# Patient Record
Sex: Male | Born: 1965 | Race: Black or African American | Hispanic: No | Smoking: Current every day smoker
Health system: Southern US, Community
[De-identification: ages and names within clinical notes are randomized; demographics above are authoritative.]

## PROBLEM LIST (undated history)

## (undated) DIAGNOSIS — Z789 Other specified health status: Secondary | ICD-10-CM

## (undated) DIAGNOSIS — F32A Depression, unspecified: Secondary | ICD-10-CM

## (undated) DIAGNOSIS — H9190 Unspecified hearing loss, unspecified ear: Secondary | ICD-10-CM

## (undated) DIAGNOSIS — T7840XA Allergy, unspecified, initial encounter: Secondary | ICD-10-CM

## (undated) DIAGNOSIS — C20 Malignant neoplasm of rectum: Secondary | ICD-10-CM

## (undated) HISTORY — PX: OTHER SURGICAL HISTORY: SHX169

## (undated) HISTORY — DX: Depression, unspecified: F32.A

## (undated) HISTORY — DX: Allergy, unspecified, initial encounter: T78.40XA

---

## 2016-11-19 DIAGNOSIS — F329 Major depressive disorder, single episode, unspecified: Secondary | ICD-10-CM | POA: Insufficient documentation

## 2016-11-21 DIAGNOSIS — F1014 Alcohol abuse with alcohol-induced mood disorder: Secondary | ICD-10-CM | POA: Insufficient documentation

## 2017-09-14 ENCOUNTER — Other Ambulatory Visit: Payer: Self-pay

## 2017-09-14 ENCOUNTER — Inpatient Hospital Stay (HOSPITAL_COMMUNITY)
Admission: EM | Admit: 2017-09-14 | Discharge: 2017-09-17 | DRG: 375 | Disposition: A | Discharge status: Home / Self Care | Payer: Medicaid Other | Attending: Internal Medicine | Admitting: Internal Medicine

## 2017-09-14 ENCOUNTER — Encounter (HOSPITAL_COMMUNITY): Payer: Self-pay | Admitting: Emergency Medicine

## 2017-09-14 ENCOUNTER — Emergency Department (HOSPITAL_COMMUNITY): Payer: Medicaid Other

## 2017-09-14 DIAGNOSIS — R740 Nonspecific elevation of levels of transaminase and lactic acid dehydrogenase [LDH]: Secondary | ICD-10-CM | POA: Diagnosis present

## 2017-09-14 DIAGNOSIS — K921 Melena: Secondary | ICD-10-CM

## 2017-09-14 DIAGNOSIS — K625 Hemorrhage of anus and rectum: Secondary | ICD-10-CM | POA: Diagnosis present

## 2017-09-14 DIAGNOSIS — F17210 Nicotine dependence, cigarettes, uncomplicated: Secondary | ICD-10-CM | POA: Diagnosis present

## 2017-09-14 DIAGNOSIS — R000 Tachycardia, unspecified: Secondary | ICD-10-CM | POA: Diagnosis present

## 2017-09-14 DIAGNOSIS — R197 Diarrhea, unspecified: Secondary | ICD-10-CM

## 2017-09-14 DIAGNOSIS — C218 Malignant neoplasm of overlapping sites of rectum, anus and anal canal: Principal | ICD-10-CM | POA: Diagnosis present

## 2017-09-14 DIAGNOSIS — Z800 Family history of malignant neoplasm of digestive organs: Secondary | ICD-10-CM

## 2017-09-14 DIAGNOSIS — D62 Acute posthemorrhagic anemia: Secondary | ICD-10-CM | POA: Diagnosis present

## 2017-09-14 DIAGNOSIS — K6289 Other specified diseases of anus and rectum: Secondary | ICD-10-CM

## 2017-09-14 DIAGNOSIS — R7401 Elevation of levels of liver transaminase levels: Secondary | ICD-10-CM

## 2017-09-14 DIAGNOSIS — K529 Noninfective gastroenteritis and colitis, unspecified: Secondary | ICD-10-CM

## 2017-09-14 HISTORY — DX: Other specified health status: Z78.9

## 2017-09-14 LAB — COMPREHENSIVE METABOLIC PANEL
ALBUMIN: 3.7 g/dL (ref 3.5–5.0)
ALT: 123 U/L — AB (ref 17–63)
ANION GAP: 11 (ref 5–15)
AST: 79 U/L — AB (ref 15–41)
Alkaline Phosphatase: 52 U/L (ref 38–126)
BILIRUBIN TOTAL: 0.6 mg/dL (ref 0.3–1.2)
BUN: 11 mg/dL (ref 6–20)
CO2: 26 mmol/L (ref 22–32)
Calcium: 9.1 mg/dL (ref 8.9–10.3)
Chloride: 102 mmol/L (ref 101–111)
Creatinine, Ser: 0.94 mg/dL (ref 0.61–1.24)
GFR calc Af Amer: >60 mL/min (ref >60)
GFR calc non Af Amer: >60 mL/min (ref >60)
GLUCOSE: 94 mg/dL (ref 65–99)
Potassium: 4.2 mmol/L (ref 3.5–5.1)
SODIUM: 139 mmol/L (ref 135–145)
Total Protein: 7.5 g/dL (ref 6.5–8.1)

## 2017-09-14 LAB — ETHANOL

## 2017-09-14 LAB — CBC
HCT: 43.2 % (ref 39.0–52.0)
HEMOGLOBIN: 13.9 g/dL (ref 13.0–17.0)
MCH: 28.1 pg (ref 26.0–34.0)
MCHC: 32.2 g/dL (ref 30.0–36.0)
MCV: 87.4 fL (ref 78.0–100.0)
Platelets: 425 10*3/uL — ABNORMAL HIGH (ref 150–400)
RBC: 4.94 MIL/uL (ref 4.22–5.81)
RDW: 15.6 % — AB (ref 11.5–15.5)
WBC: 6.2 10*3/uL (ref 4.0–10.5)

## 2017-09-14 LAB — ACETAMINOPHEN LEVEL

## 2017-09-14 LAB — TYPE AND SCREEN
ABO/RH(D): O POS
Antibody Screen: NEGATIVE

## 2017-09-14 LAB — C DIFFICILE QUICK SCREEN W PCR REFLEX
C DIFFICILE (CDIFF) TOXIN: NEGATIVE
C DIFFICLE (CDIFF) ANTIGEN: NEGATIVE
C Diff interpretation: NOT DETECTED

## 2017-09-14 LAB — POC OCCULT BLOOD, ED: Fecal Occult Bld: POSITIVE — AB

## 2017-09-14 LAB — SALICYLATE LEVEL

## 2017-09-14 MED ORDER — ACETAMINOPHEN 325 MG PO TABS
650.0000 mg | ORAL_TABLET | Freq: Four times a day (QID) | ORAL | Status: DC | PRN
  Administered 2017-09-15: 650 mg via ORAL
  Filled 2017-09-14: qty 2

## 2017-09-14 MED ORDER — SODIUM CHLORIDE 0.9 % IV SOLN
INTRAVENOUS | Status: DC
  Administered 2017-09-14: 100 mL/h via INTRAVENOUS

## 2017-09-14 MED ORDER — METRONIDAZOLE IN NACL 5-0.79 MG/ML-% IV SOLN
500.0000 mg | Freq: Once | INTRAVENOUS | Status: AC
  Administered 2017-09-14: 500 mg via INTRAVENOUS
  Filled 2017-09-14: qty 100

## 2017-09-14 MED ORDER — ACETAMINOPHEN 650 MG RE SUPP: 650.0000 mg | Freq: Four times a day (QID) | RECTAL | Status: DC | PRN

## 2017-09-14 MED ORDER — CIPROFLOXACIN IN D5W 400 MG/200ML IV SOLN
400.0000 mg | Freq: Two times a day (BID) | INTRAVENOUS | Status: DC
  Administered 2017-09-15 – 2017-09-16 (×3): 400 mg via INTRAVENOUS
  Filled 2017-09-14 (×3): qty 200

## 2017-09-14 MED ORDER — SODIUM CHLORIDE 0.9 % IV BOLUS (SEPSIS): 500.0000 mL | Freq: Once | INTRAVENOUS | Status: DC

## 2017-09-14 MED ORDER — METRONIDAZOLE IN NACL 5-0.79 MG/ML-% IV SOLN
500.0000 mg | Freq: Three times a day (TID) | INTRAVENOUS | Status: DC
  Administered 2017-09-15 – 2017-09-16 (×5): 500 mg via INTRAVENOUS
  Filled 2017-09-14 (×5): qty 100

## 2017-09-14 MED ORDER — CIPROFLOXACIN IN D5W 400 MG/200ML IV SOLN
400.0000 mg | Freq: Once | INTRAVENOUS | Status: AC
  Administered 2017-09-14: 400 mg via INTRAVENOUS
  Filled 2017-09-14: qty 200

## 2017-09-14 MED ORDER — SODIUM CHLORIDE 0.9 % IV BOLUS (SEPSIS)
1000.0000 mL | Freq: Once | INTRAVENOUS | Status: AC
  Administered 2017-09-14: 1000 mL via INTRAVENOUS

## 2017-09-14 MED ORDER — SODIUM CHLORIDE 0.9 % IV SOLN
INTRAVENOUS | Status: AC
  Administered 2017-09-14: 19:00:00 via INTRAVENOUS

## 2017-09-14 NOTE — Consult Note (Signed)
Referring Provider: Dr. Thurnell Garbe  Primary Care Physician:  None Primary Gastroenterologist:  Dr. Gala Romney   Date of Admission: 09/14/17 Date of Consultation: 09/14/17  Reason for Consultation:  Proctocolitis   HPI:  Ryan Ogborn is a 52 y.o. year old male presenting to the ED today with worsening rectal bleeding and diarrhea for the past 2 weeks. He had presented to [facility] for similar presentation and was given Cipro and "something to stop the bleeding". Appears he was given Lysteda. He is not on any chronic medications, and he does not have a PCP.  Thus far, Cdiff quick scan with PCR reflex negative. GI pathogen panel has been ordered and pending. CT scan without contrast with dilatation and wall thickening of distal sigmoid colon and proximal rectum with surrounding inflammation, concerning for proctocolitis. Narrowing of the most distal aspect of rectum also noted. Rectal exam in ED with large amount of maroon diarrhea stool at time of examination. Hgb normal at 13.9. Empiric Cipro and Flagyl have been ordered.   States rectal bleeding has been going on several months but started worsening 2 weeks ago. Associated tenesmus. Occasional abdominal discomfort but nothing significant. Associated diarrhea starting 3 weeks ago. Not having diarrhea daily, usually 1-2 loose stools. Was afraid to eat, thinking it would help decrease incidence of diarrhea. States he is hungry and would like to eat. No nausea or vomiting. Has chills but no fever. Denies any prior imaging. No sick contacts. City water. Ibuprofen every once in awhile. States a few weeks ago weighed 152. The other night took one dose of excedrin. Denies routine tylenol use. States he has been under a lot of personal stress. Living with his sister, Dorien Chihuahua, currently. Previously lived in [facility].    Father diagnosed with colon cancer in his 41s, passed from colon cancer.   Past Medical History:  Diagnosis Date  . Medical history  non-contributory     Past Surgical History:  Procedure Laterality Date  . None      Prior to Admission medications   Medication Sig Start Date End Date Taking? Authorizing Provider  ciprofloxacin (CIPRO) 500 MG tablet Take 500 mg by mouth 2 (two) times daily. for 10 days 09/03/17   [provider]  tranexamic acid (LYSTEDA) 650 MG TABS tablet Take 1,300 mg by mouth 3 (three) times daily. 08/31/17   [provider]    Current Facility-Administered Medications  Medication Dose Route Frequency Provider Last Rate Last Dose  . 0.9 %  sodium chloride infusion   Intravenous Continuous Francine Graven, DO      . ciprofloxacin (CIPRO) IVPB 400 mg  400 mg Intravenous Once Francine Graven, DO      . metroNIDAZOLE (FLAGYL) IVPB 500 mg  500 mg Intravenous Once Francine Graven, DO      . sodium chloride 0.9 % bolus 1,000 mL  1,000 mL Intravenous Once Francine Graven, DO       Current Outpatient Medications  Medication Sig Dispense Refill  . ciprofloxacin (CIPRO) 500 MG tablet Take 500 mg by mouth 2 (two) times daily. for 10 days  0  . tranexamic acid (LYSTEDA) 650 MG TABS tablet Take 1,300 mg by mouth 3 (three) times daily.  0    Allergies as of 09/14/2017  . (Not on File)    Family History  Problem Relation Age of Onset  . Colon cancer Father        diagnosed in his 34s, succumbed to disease  . Inflammatory bowel disease Neg  Hx     Social History   Socioeconomic History  . Marital status: Legally Separated    Spouse name: Not on file  . Number of children: Not on file  . Years of education: Not on file  . Highest education level: Not on file  Social Needs  . Financial resource strain: Not on file  . Food insecurity - worry: Not on file  . Food insecurity - inability: Not on file  . Transportation needs - medical: Not on file  . Transportation needs - non-medical: Not on file  Occupational History  . Not on file  Tobacco Use  . Smoking status: Current  Every Day Smoker    Packs/day: 1.00    Types: Cigarettes    Start date: 08/14/1997  . Smokeless tobacco: Never Used  Substance and Sexual Activity  . Alcohol use: Yes    Comment: 2-3 beers, 3-4 times per week   . Drug use: No  . Sexual activity: Not on file  Other Topics Concern  . Not on file  Social History Narrative  . Not on file    Review of Systems: Gen: see HPI  CV: Denies chest pain, heart palpitations, syncope, edema  Resp: Denies shortness of breath with rest, cough, wheezing GI: see HPI  GU : Denies urinary burning, urinary frequency, urinary incontinence.  MS: Denies joint pain,swelling, cramping Derm: Denies rash, itching, dry skin Psych: Denies depression, anxiety,confusion, or memory loss Heme: see HPI   Physical Exam: Vital signs in last 24 hours: Temp:  [98.4 F (36.9 C)] 98.4 F (36.9 C) (02/11 1208) Pulse Rate:  [112] 112 (02/11 1208) Resp:  [16] 16 (02/11 1208) BP: (134)/(99) 134/99 (02/11 1208) SpO2:  [100 %] 100 % (02/11 1208) Weight:  [146 lb (66.2 kg)] 146 lb (66.2 kg) (02/11 1206)   General:   Alert,  Well-developed, well-nourished, pleasant and cooperative in NAD, tearful at times.  Head:  Normocephalic and atraumatic. Eyes:  Sclera clear, no icterus.   Ears:  Normal auditory acuity. Nose:  No deformity, discharge,  or lesions. Mouth:  No deformity or lesions, dentition normal. Lungs:  Clear throughout to auscultation.    Heart:  S1 S2 present, slightly tachycardic in the low 1teens  Abdomen:  Soft, nontender and nondistended. No masses, hepatosplenomegaly or hernias noted. Normal bowel sounds, without guarding, and without rebound.   Rectal:  Deferred until time of colonoscopy.   Msk:  Symmetrical without gross deformities. Normal posture. Extremities:  Without edema. Neurologic:  Alert and  oriented x4 Psych:  Alert and cooperative. Normal mood and affect.  Intake/Output from previous day: No intake/output data recorded. Intake/Output  this shift: No intake/output data recorded.  Lab Results: Recent Labs    09/14/17 1246  WBC 6.2  HGB 13.9  HCT 43.2  PLT 425*   BMET Recent Labs    09/14/17 1246  NA 139  K 4.2  CL 102  CO2 26  GLUCOSE 94  BUN 11  CREATININE 0.94  CALCIUM 9.1   LFT Recent Labs    09/14/17 1246  PROT 7.5  ALBUMIN 3.7  AST 79*  ALT 123*  ALKPHOS 52  BILITOT 0.6   C-Diff Recent Labs    09/14/17 1345  CDIFFTOX NEGATIVE    Studies/Results: Ct Abdomen Pelvis Wo Contrast  Result Date: 09/14/2017 CLINICAL DATA:  Rectal bleeding. EXAM: CT ABDOMEN AND PELVIS WITHOUT CONTRAST TECHNIQUE: Multidetector CT imaging of the abdomen and pelvis was performed following the standard protocol without IV  contrast. COMPARISON:  None. FINDINGS: Lower chest: No acute abnormality. Hepatobiliary: No focal liver abnormality is seen. No gallstones, gallbladder wall thickening, or biliary dilatation. Pancreas: Unremarkable. No pancreatic ductal dilatation or surrounding inflammatory changes. Spleen: Normal in size without focal abnormality. Adrenals/Urinary Tract: Adrenal glands are unremarkable. Kidneys are normal, without renal calculi, focal lesion, or hydronephrosis. Bladder is unremarkable. Stomach/Bowel: The stomach appears normal. The appendix is not clearly visualized. Stool is noted throughout the colon. There is dilatation of the rectum and distal sigmoid colon with wall thickening and surrounding inflammation. There narrowing of the most distal aspect of the rectum, and neoplasm cannot be excluded. Vascular/Lymphatic: No significant vascular findings are present. No enlarged abdominal or pelvic lymph nodes. Reproductive: Prostate is unremarkable. Other: No abdominal wall hernia or abnormality. No abdominopelvic ascites. Musculoskeletal: No acute or significant osseous findings. IMPRESSION: Dilatation and wall thickening of the distal sigmoid colon and proximal rectum is noted with surrounding inflammation,  concerning for proctocolitis. Narrowing of the most distal aspect of the rectum is noted, and possible neoplasm or obstruction cannot be excluded. Sigmoidoscopy is recommended for further evaluation. Electronically Signed   By: Marijo Conception, M.D.   On: 09/14/2017 15:19    Impression: 52 year old male with history of chronic intermittent rectal bleeding, now with increased rectal bleeding and associated diarrhea and tenesmus for the past 2 weeks. CT scan with concern for proctocolitis, narrowing of distal aspect of rectum noted. Rectal exam performed by ED. No prior colonoscopy. He reports seeking care at [facility] about a week ago, and it appears he was prescribed Cipro and another medication (possbily Lysteda). Cdiff quick scan negative, and GI pathogen panel has been collected and pending.  Has taken Ibuprofen intermittently and one dose of excedrin a few nights ago.  Clinically, he appears comfortable and non-toxic. He is quite tearful at time of visit and was reassured regarding plan of care. Will await GI pathogen panel. Will ultimately need direct visualization via colonoscopy when clinically appropriate and anticipate possibly as inpatient. Agree with empiric antibiotics. Will also order clear liquids for now.   Elevated transaminases: unknown baseline. AST 79, ALT 123. Endorses alcohol use. Hepatitis panel ordered. Does not endorse recent or frequent tylenol use. Elevation multifactorial in setting of acute illness. Will continue to follow.   Plan: Clear liquids Agree with Cipro and Flagyl Await GI pathogen panel Repeat HFP tomorrow Will need diagnostic colonoscopy at some point in the near future when clinically appropriate: will continue to follow with you during hospitalization.  Annitta Needs, PhD, ANP-BC [facility]     LOS: 0 days    09/14/2017, 4:23 PM

## 2017-09-14 NOTE — ED Provider Notes (Signed)
[facility] EMERGENCY DEPARTMENT Provider Note   CSN: [id number] Arrival date & time: 09/14/17  1203     History   Chief Complaint Chief Complaint  Patient presents with  . Rectal Bleeding    HPI Tanner Dudley is a 52 y.o. male.  HPI  Pt was seen at 1330. Per pt, c/o gradual onset and persistence of constant rectal bleeding for the past 2 weeks. Has been associated with diarrhea. Pt states the blood and stool "just run out of him." Pt has been wearing a Depends. Pt states he was evaluated at [facility] ED for this complaint 2 weeks ago, but does not recall what they told him, other that to f/u with a GI doctor. Pt has not obtained f/u with GI MD yet. Pt states he has not been eating well because it makes him feel like he needs to go to the bathroom. States he occasionally will have abd "pain." Denies rectal pain, no N/V, no CP/SOB, no fevers, no back pain.    History reviewed. No pertinent past medical history.  There are no active problems to display for this patient.   History reviewed. No pertinent surgical history.    Home Medications    Prior to Admission medications   Not on File    Family History History reviewed. No pertinent family history.  Social History Social History   Tobacco Use  . Smoking status: Never Smoker  . Smokeless tobacco: Never Used  Substance Use Topics  . Alcohol use: Not on file  . Drug use: Not on file     Allergies   Patient has no allergy information on record.   Review of Systems Review of Systems ROS: Statement: All systems negative except as marked or noted in the HPI; Constitutional: Negative for fever and chills. ; ; Eyes: Negative for eye pain, redness and discharge. ; ; ENMT: Negative for ear pain, hoarseness, nasal congestion, sinus pressure and sore throat. ; ; Cardiovascular: Negative for chest pain, palpitations, diaphoresis, dyspnea and peripheral edema. ; ; Respiratory: Negative for cough, wheezing and stridor.  ; ; Gastrointestinal: Negative for nausea, vomiting, hematemesis, jaundice. +abd pain, diarrhea, rectal bleeding. ; ; Genitourinary: Negative for dysuria, flank pain and hematuria. ; ; Musculoskeletal: Negative for back pain and neck pain. Negative for swelling and trauma.; ; Skin: Negative for pruritus, rash, abrasions, blisters, bruising and skin lesion.; ; Neuro: Negative for headache, lightheadedness and neck stiffness. Negative for weakness, altered level of consciousness, altered mental status, extremity weakness, paresthesias, involuntary movement, seizure and syncope.       Physical Exam Updated Vital Signs BP (!) 134/99 (BP Location: Right Arm)   Pulse (!) 112   Temp 98.4 F (36.9 C) (Oral)   Resp 16   Ht 5\' 8"  (1.727 m)   Wt 66.2 kg (146 lb)   SpO2 100%   BMI 22.20 kg/m   Physical Exam 1335: Physical examination:  Nursing notes reviewed; Vital signs and O2 SAT reviewed;  Constitutional: Well developed, Well nourished, Well hydrated, In no acute distress; Head:  Normocephalic, atraumatic; Eyes: EOMI, PERRL, No scleral icterus; ENMT: Mouth and pharynx normal, Mucous membranes moist; Neck: Supple, Full range of motion, No lymphadenopathy; Cardiovascular: Tachycardic rate and rhythm, No gallop; Respiratory: Breath sounds clear & equal bilaterally, No wheezes.  Speaking full sentences with ease, Normal respiratory effort/excursion; Chest: Nontender, Movement normal; Abdomen: Soft, +mild diffuse tenderness to palp. No rebound or guarding. Nondistended, Normal bowel sounds. Rectal exam performed w/permission of pt and  ED RN chaperone present.  Anal tone normal.  Non-tender, large amount of maroon diarrheal stool coming out of pt's rectum after exam, heme positive..; Genitourinary: No CVA tenderness; Spine:  No midline CS, TS, LS tenderness.;; Extremities: Pulses normal, No tenderness, No edema, No calf edema or asymmetry.; Neuro: AA&Ox3, vague historian. Major CN grossly intact.  Speech clear.  No gross focal motor or sensory deficits in extremities.; Skin: Color normal, Warm, Dry.    ED Treatments / Results  Labs (all labs ordered are listed, but only abnormal results are displayed)   EKG  EKG Interpretation None       Radiology   Procedures Procedures (including critical care time)  Medications Ordered in ED Medications  0.9 %  sodium chloride infusion (not administered)     Initial Impression / Assessment and Plan / ED Course  I have reviewed the triage vital signs and the nursing notes.  Pertinent labs & imaging results that were available during my care of the patient were reviewed by me and considered in my medical decision making (see chart for details).  MDM Reviewed: previous chart, nursing note and vitals Reviewed previous: labs Interpretation: labs and CT scan    Results for orders placed or performed during the hospital encounter of 09/14/17  C difficile quick scan w PCR reflex  Result Value Ref Range   C Diff antigen NEGATIVE NEGATIVE   C Diff toxin NEGATIVE NEGATIVE   C Diff interpretation No C. difficile detected.   Comprehensive metabolic panel  Result Value Ref Range   Sodium 139 135 - 145 mmol/L   Potassium 4.2 3.5 - 5.1 mmol/L   Chloride 102 101 - 111 mmol/L   CO2 26 22 - 32 mmol/L   Glucose, Bld 94 65 - 99 mg/dL   BUN 11 6 - 20 mg/dL   Creatinine, Ser 0.94 0.61 - 1.24 mg/dL   Calcium 9.1 8.9 - 10.3 mg/dL   Total Protein 7.5 6.5 - 8.1 g/dL   Albumin 3.7 3.5 - 5.0 g/dL   AST 79 (H) 15 - 41 U/L   ALT 123 (H) 17 - 63 U/L   Alkaline Phosphatase 52 38 - 126 U/L   Total Bilirubin 0.6 0.3 - 1.2 mg/dL   GFR calc non Af Amer >60 >60 mL/min   GFR calc Af Amer >60 >60 mL/min   Anion gap 11 5 - 15  CBC  Result Value Ref Range   WBC 6.2 4.0 - 10.5 K/uL   RBC 4.94 4.22 - 5.81 MIL/uL   Hemoglobin 13.9 13.0 - 17.0 g/dL   HCT 43.2 39.0 - 52.0 %   MCV 87.4 78.0 - 100.0 fL   MCH 28.1 26.0 - 34.0 pg   MCHC 32.2 30.0 - 36.0 g/dL   RDW  15.6 (H) 11.5 - 15.5 %   Platelets 425 (H) 150 - 400 K/uL  POC occult blood, ED  Result Value Ref Range   Fecal Occult Bld POSITIVE (A) NEGATIVE  Type and screen [facility]  Result Value Ref Range   ABO/RH(D) O POS    Antibody Screen NEG    Sample Expiration      09/17/2017 Performed at [facility], [address], [place], [place] [address]    Ct Abdomen Pelvis Wo Contrast Result Date: 09/14/2017 CLINICAL DATA:  Rectal bleeding. EXAM: CT ABDOMEN AND PELVIS WITHOUT CONTRAST TECHNIQUE: Multidetector CT imaging of the abdomen and pelvis was performed following the standard protocol without IV contrast. COMPARISON:  None.  FINDINGS: Lower chest: No acute abnormality. Hepatobiliary: No focal liver abnormality is seen. No gallstones, gallbladder wall thickening, or biliary dilatation. Pancreas: Unremarkable. No pancreatic ductal dilatation or surrounding inflammatory changes. Spleen: Normal in size without focal abnormality. Adrenals/Urinary Tract: Adrenal glands are unremarkable. Kidneys are normal, without renal calculi, focal lesion, or hydronephrosis. Bladder is unremarkable. Stomach/Bowel: The stomach appears normal. The appendix is not clearly visualized. Stool is noted throughout the colon. There is dilatation of the rectum and distal sigmoid colon with wall thickening and surrounding inflammation. There narrowing of the most distal aspect of the rectum, and neoplasm cannot be excluded. Vascular/Lymphatic: No significant vascular findings are present. No enlarged abdominal or pelvic lymph nodes. Reproductive: Prostate is unremarkable. Other: No abdominal wall hernia or abnormality. No abdominopelvic ascites. Musculoskeletal: No acute or significant osseous findings. IMPRESSION: Dilatation and wall thickening of the distal sigmoid colon and proximal rectum is noted with surrounding inflammation, concerning for proctocolitis. Narrowing of the most distal aspect of the rectum is noted, and  possible neoplasm or obstruction cannot be excluded. Sigmoidoscopy is recommended for further evaluation. Electronically Signed   By: Marijo Conception, M.D.   On: 09/14/2017 15:19     1340:  Pt having copious bloody diarrhea during rectal exam. Pt cannot remember what he was told during his [facility] ED visit other than to f/u with GI MD. States he was given meds; unknown what they were or what they were to treat. Will obtain medical records.  1530:  CT scan as above; will start IV cipro/flagyl. Pt will multiple bloody diarrheal stools while in the ED. H/H stable at this time. IVF bolus and gtt started. Remains NPO. Continue to await records from [facility]. T/C to GI Dr. Gala Romney, case discussed, including:  HPI, pertinent PM/SHx, VS/PE, dx testing, ED course and treatment:  Agreeable to consult.   1535:  T/C to [facility] Dr. Maudie Mercury, case discussed, including:  HPI, pertinent PM/SHx, VS/PE, dx testing, ED course and treatment:  Agreeable to admit.      Final Clinical Impressions(s) / ED Diagnoses   Final diagnoses:  None    ED Discharge Orders    None       Francine Graven, DO 09/16/17 0272

## 2017-09-14 NOTE — ED Notes (Signed)
Pt going to CT

## 2017-09-14 NOTE — ED Notes (Signed)
EDP at the bedside, rectal exam, pt having bloody soft stool. Pt cleaned up, sample taken to lab

## 2017-09-14 NOTE — H&P (Addendum)
TRH H&P   Patient Demographics:    Tanner Dudley, is a 52 y.o. male  MRN: [id number]   DOB - 1965-12-24  Admit Date - 09/14/2017  Outpatient Primary MD for the patient is Patient, No Pcp Per  Referring MD/NP/PA: Francine Graven  Outpatient Specialists:   Patient coming from: home  Chief Complaint  Patient presents with  . Rectal Bleeding      HPI:    Tanner Dudley  is a 52 y.o. male, w c/o diarrhea and rectal bleeding intermittently for the past 2 weeks.  Today his diarrhea was worse, went at least 10 times and had multiple bleeding per rectum.  On toilet paper and in toilet bowel.  Pt has never had prior colonscopy.  No family hx of Inflammatory bowel disease.  Pt denies any odd foods eaten.  No recent travel. Pt presented due to worsening diarrhea and rectal bleeding.   In ED,  CT abd/pelvis IMPRESSION: Dilatation and wall thickening of the distal sigmoid colon and proximal rectum is noted with surrounding inflammation, concerning for proctocolitis. Narrowing of the most distal aspect of the rectum is noted, and possible neoplasm or obstruction cannot be excluded.Sigmoidoscopy is recommended for further evaluation.   Na 139 K 4.2, Bun 11, Creatinine 0.94 Ast 79, Alt 123  Wbc 6.2, Hgb 13.9 Plt 425  C. Diff and GI pathogen panel pending  Etoh <10   Pt will be admitted for w/up of diarrhea and rectal bleeding.  ? Proctocolitis        Review of systems:    In addition to the HPI above,  No Fever-chills, No Headache, No changes with Vision or hearing, No problems swallowing food or Liquids, No Chest pain, Cough or Shortness of Breath, No Abdominal pain,  No Blood in Urine, No dysuria, No new skin rashes or bruises, No new joints pains-aches,  No new weakness, tingling, numbness in any extremity, No recent weight gain or loss, No polyuria, polydypsia  or polyphagia, No significant Mental Stressors.  A full 10 point Review of Systems was done, except as stated above, all other Review of Systems were negative.   With Past History of the following :    Past Medical History:  Diagnosis Date  . Medical history non-contributory       Past Surgical History:  Procedure Laterality Date  . None        Social History:     Social History   Tobacco Use  . Smoking status: Current Every Day Smoker    Packs/day: 1.00    Types: Cigarettes    Start date: 08/14/1997  . Smokeless tobacco: Never Used  Substance Use Topics  . Alcohol use: Yes    Comment: 2-3 beers, 3-4 times per week      Lives - at home  Mobility -   Walks by self   Family History :  Family History  Problem Relation Age of Onset  . Colon cancer Father        diagnosed in his 54s, succumbed to disease  . Inflammatory bowel disease Neg Hx       Home Medications:   Prior to Admission medications   Medication Sig Start Date End Date Taking? Authorizing Provider  ciprofloxacin (CIPRO) 500 MG tablet Take 500 mg by mouth 2 (two) times daily. for 10 days 09/03/17   [provider]  tranexamic acid (LYSTEDA) 650 MG TABS tablet Take 1,300 mg by mouth 3 (three) times daily. 08/31/17   [provider]     Allergies:    No Known Allergies   Physical Exam:   Vitals  Blood pressure 115/88, pulse 78, temperature 98.3 F (36.8 C), temperature source Oral, resp. rate 19, height 5\' 8"  (1.727 m), weight 64.7 kg (142 lb 10.2 oz), SpO2 100 %.   1. General lying in bed in NAD,    2. Normal affect and insight, Not Suicidal or Homicidal, Awake Alert, Oriented X 3.  3. No F.N deficits, ALL C.Nerves Intact, Strength 5/5 all 4 extremities, Sensation intact all 4 extremities, Plantars down going.  4. Ears and Eyes appear Normal, Conjunctivae clear, PERRLA. Moist Oral Mucosa.  5. Supple Neck, No JVD, No cervical lymphadenopathy appriciated, No  Carotid Bruits.  6. Symmetrical Chest wall movement, Good air movement bilaterally, CTAB.  7. RRR, No Gallops, Rubs or Murmurs, No Parasternal Heave.  8. Positive Bowel Sounds, Abdomen Soft, No tenderness, No organomegaly appriciated,No rebound -guarding or rigidity.  9.  No Cyanosis, Normal Skin Turgor, No Skin Rash or Bruise.  10. Good muscle tone,  joints appear normal , no effusions, Normal ROM.  11. No Palpable Lymph Nodes in Neck or Axillae    Data Review:    CBC Recent Labs  Lab 09/14/17 1246  WBC 6.2  HGB 13.9  HCT 43.2  PLT 425*  MCV 87.4  MCH 28.1  MCHC 32.2  RDW 15.6*   ------------------------------------------------------------------------------------------------------------------  Chemistries  Recent Labs  Lab 09/14/17 1246  NA 139  K 4.2  CL 102  CO2 26  GLUCOSE 94  BUN 11  CREATININE 0.94  CALCIUM 9.1  AST 79*  ALT 123*  ALKPHOS 52  BILITOT 0.6   ------------------------------------------------------------------------------------------------------------------ estimated creatinine clearance is 87 mL/min (by C-G formula based on SCr of 0.94 mg/dL). ------------------------------------------------------------------------------------------------------------------ No results for input(s): TSH, T4TOTAL, T3FREE, THYROIDAB in the last 72 hours.  Invalid input(s): FREET3  Coagulation profile No results for input(s): INR, PROTIME in the last 168 hours. ------------------------------------------------------------------------------------------------------------------- No results for input(s): DDIMER in the last 72 hours. -------------------------------------------------------------------------------------------------------------------  Cardiac Enzymes No results for input(s): CKMB, TROPONINI, MYOGLOBIN in the last 168 hours.  Invalid input(s):  CK ------------------------------------------------------------------------------------------------------------------ No results found for: BNP   ---------------------------------------------------------------------------------------------------------------  Urinalysis No results found for: COLORURINE, APPEARANCEUR, LABSPEC, PHURINE, GLUCOSEU, HGBUR, BILIRUBINUR, KETONESUR, PROTEINUR, UROBILINOGEN, NITRITE, LEUKOCYTESUR  ----------------------------------------------------------------------------------------------------------------   Imaging Results:    Ct Abdomen Pelvis Wo Contrast  Result Date: 09/14/2017 CLINICAL DATA:  Rectal bleeding. EXAM: CT ABDOMEN AND PELVIS WITHOUT CONTRAST TECHNIQUE: Multidetector CT imaging of the abdomen and pelvis was performed following the standard protocol without IV contrast. COMPARISON:  None. FINDINGS: Lower chest: No acute abnormality. Hepatobiliary: No focal liver abnormality is seen. No gallstones, gallbladder wall thickening, or biliary dilatation. Pancreas: Unremarkable. No pancreatic ductal dilatation or surrounding inflammatory changes. Spleen: Normal in size without focal abnormality. Adrenals/Urinary Tract: Adrenal glands are unremarkable. Kidneys are normal, without  renal calculi, focal lesion, or hydronephrosis. Bladder is unremarkable. Stomach/Bowel: The stomach appears normal. The appendix is not clearly visualized. Stool is noted throughout the colon. There is dilatation of the rectum and distal sigmoid colon with wall thickening and surrounding inflammation. There narrowing of the most distal aspect of the rectum, and neoplasm cannot be excluded. Vascular/Lymphatic: No significant vascular findings are present. No enlarged abdominal or pelvic lymph nodes. Reproductive: Prostate is unremarkable. Other: No abdominal wall hernia or abnormality. No abdominopelvic ascites. Musculoskeletal: No acute or significant osseous findings. IMPRESSION: Dilatation  and wall thickening of the distal sigmoid colon and proximal rectum is noted with surrounding inflammation, concerning for proctocolitis. Narrowing of the most distal aspect of the rectum is noted, and possible neoplasm or obstruction cannot be excluded. Sigmoidoscopy is recommended for further evaluation. Electronically Signed   By: Marijo Conception, M.D.   On: 09/14/2017 15:19       Assessment & Plan:    Principal Problem:   Rectal bleeding Active Problems:   Diarrhea    Rectal bleeding ? Proctocolitis cipro 400mg  iv bid Flagyl 500mg  iv tid NPO Hydrate with ns iv GI consulted by Ed, appreciate input  Diarrhea Check C. Diff Check GI Pathogen panel  Abnormal liver function Check acute hepatitis panel Consider further w/up such as ferritin, iron, tibc, ceruloplasmin, alpha 1 antitripsin, ANA, antismooth muscle ab, celiac panel  Tachycardia Tele Trop I q6h x3 If persistent or if trop positive    DVT Prophylaxis  Lovenox - SCDs  AM Labs Ordered, also please review Full Orders  Family Communication: Admission, patients condition and plan of care including tests being ordered have been discussed with the patient  who indicate understanding and agree with the plan and Code Status.  Code Status FULL CODE  Likely DC to  home  Condition GUARDED    Consults called:   GI by ED  Admission status: inpatient  Time spent in minutes : 45   Jani Gravel M.D on 09/14/2017 at 7:04 PM  Between 7am to 7pm - Pager - [phone]  . After 7pm go to [email or web address] - password [facility]  [facility] - Office  [phone]

## 2017-09-14 NOTE — ED Triage Notes (Signed)
Pt reports rectal bleeding for last several weeks. Pt reports seen for same at [facility]. Pt reports bright red bleeding with voiding.

## 2017-09-14 NOTE — ED Notes (Signed)
Hospitalist at the bedside 

## 2017-09-14 NOTE — ED Notes (Signed)
Pt has appetite, but not eating well, due to eating make him feel like he needs to go to the bathroom, continues to have diarrhea, bright red blood, pt is pale

## 2017-09-15 ENCOUNTER — Inpatient Hospital Stay (HOSPITAL_COMMUNITY): Payer: Medicaid Other

## 2017-09-15 ENCOUNTER — Encounter (HOSPITAL_COMMUNITY): Payer: Self-pay | Admitting: Gastroenterology

## 2017-09-15 DIAGNOSIS — K529 Noninfective gastroenteritis and colitis, unspecified: Secondary | ICD-10-CM

## 2017-09-15 DIAGNOSIS — K625 Hemorrhage of anus and rectum: Secondary | ICD-10-CM

## 2017-09-15 LAB — COMPREHENSIVE METABOLIC PANEL
ALT: 93 U/L — ABNORMAL HIGH (ref 17–63)
AST: 58 U/L — AB (ref 15–41)
Albumin: 2.8 g/dL — ABNORMAL LOW (ref 3.5–5.0)
Alkaline Phosphatase: 39 U/L (ref 38–126)
Anion gap: 8 (ref 5–15)
BUN: 9 mg/dL (ref 6–20)
CHLORIDE: 105 mmol/L (ref 101–111)
CO2: 23 mmol/L (ref 22–32)
Calcium: 8.3 mg/dL — ABNORMAL LOW (ref 8.9–10.3)
Creatinine, Ser: 0.79 mg/dL (ref 0.61–1.24)
Glucose, Bld: 114 mg/dL — ABNORMAL HIGH (ref 65–99)
POTASSIUM: 3.6 mmol/L (ref 3.5–5.1)
Sodium: 136 mmol/L (ref 135–145)
Total Bilirubin: 0.7 mg/dL (ref 0.3–1.2)
Total Protein: 5.6 g/dL — ABNORMAL LOW (ref 6.5–8.1)

## 2017-09-15 LAB — GASTROINTESTINAL PANEL BY PCR, STOOL (REPLACES STOOL CULTURE)
Adenovirus F40/41: NOT DETECTED
Astrovirus: NOT DETECTED
CAMPYLOBACTER SPECIES: NOT DETECTED
CRYPTOSPORIDIUM: NOT DETECTED
Cyclospora cayetanensis: NOT DETECTED
Entamoeba histolytica: NOT DETECTED
Enteroaggregative E coli (EAEC): NOT DETECTED
Enteropathogenic E coli (EPEC): NOT DETECTED
Enterotoxigenic E coli (ETEC): NOT DETECTED
Giardia lamblia: NOT DETECTED
NOROVIRUS GI/GII: NOT DETECTED
PLESIMONAS SHIGELLOIDES: NOT DETECTED
ROTAVIRUS A: NOT DETECTED
SALMONELLA SPECIES: NOT DETECTED
SHIGA LIKE TOXIN PRODUCING E COLI (STEC): NOT DETECTED
SHIGELLA/ENTEROINVASIVE E COLI (EIEC): NOT DETECTED
Sapovirus (I, II, IV, and V): NOT DETECTED
Vibrio cholerae: NOT DETECTED
Vibrio species: NOT DETECTED
YERSINIA ENTEROCOLITICA: NOT DETECTED

## 2017-09-15 LAB — CBC
HCT: 34.3 % — ABNORMAL LOW (ref 39.0–52.0)
HCT: 33.8 % — ABNORMAL LOW (ref 39.0–52.0)
Hemoglobin: 11 g/dL — ABNORMAL LOW (ref 13.0–17.0)
Hemoglobin: 10.7 g/dL — ABNORMAL LOW (ref 13.0–17.0)
MCH: 27.6 pg (ref 26.0–34.0)
MCH: 27.9 pg (ref 26.0–34.0)
MCHC: 31.7 g/dL (ref 30.0–36.0)
MCHC: 32.1 g/dL (ref 30.0–36.0)
MCV: 87.1 fL (ref 78.0–100.0)
MCV: 87.1 fL (ref 78.0–100.0)
PLATELETS: 371 10*3/uL (ref 150–400)
Platelets: 343 10*3/uL (ref 150–400)
RBC: 3.88 MIL/uL — ABNORMAL LOW (ref 4.22–5.81)
RBC: 3.94 MIL/uL — AB (ref 4.22–5.81)
RDW: 15.3 % (ref 11.5–15.5)
RDW: 15.3 % (ref 11.5–15.5)
WBC: 6.1 10*3/uL (ref 4.0–10.5)
WBC: 4.7 10*3/uL (ref 4.0–10.5)

## 2017-09-15 LAB — TROPONIN I
Troponin I: <0.03 ng/mL (ref <0.03)
Troponin I: <0.03 ng/mL (ref <0.03)

## 2017-09-15 LAB — RAPID URINE DRUG SCREEN, HOSP PERFORMED

## 2017-09-15 LAB — TSH: TSH: 1.234 u[IU]/mL (ref 0.350–4.500)

## 2017-09-15 MED ORDER — PEG 3350-KCL-NA BICARB-NACL 420 G PO SOLR
2000.0000 mL | Freq: Once | ORAL | Status: AC
  Administered 2017-09-15: 2000 mL via ORAL
  Filled 2017-09-15: qty 4000

## 2017-09-15 MED ORDER — PEG 3350-KCL-NA BICARB-NACL 420 G PO SOLR
2000.0000 mL | Freq: Once | ORAL | Status: AC
  Administered 2017-09-16: 2000 mL via ORAL

## 2017-09-15 MED ORDER — POTASSIUM CHLORIDE IN NACL 20-0.9 MEQ/L-% IV SOLN
INTRAVENOUS | Status: DC
  Administered 2017-09-15 – 2017-09-17 (×4): via INTRAVENOUS

## 2017-09-15 NOTE — Progress Notes (Addendum)
Subjective: One episode of small loose stool overnight with small amount bright red blood per rectum. No abdominal pain, N/V.   Objective: Vital signs in last 24 hours: Temp:  [97.5 F (36.4 C)-98.7 F (37.1 C)] 97.5 F (36.4 C) (02/12 0527) Pulse Rate:  [72-112] 72 (02/12 0527) Resp:  [15-20] 15 (02/12 0527) BP: (111-134)/(80-99) 119/82 (02/12 0527) SpO2:  [99 %-100 %] 100 % (02/12 0527) Weight:  [140 lb 3.4 oz (63.6 kg)-146 lb (66.2 kg)] 140 lb 3.4 oz (63.6 kg) (02/12 0527) Last BM Date: 09/14/17 General:   Alert and oriented, pleasant Head:  Normocephalic and atraumatic. Eyes:  No icterus, sclera clear. Conjuctiva pink.  Abdomen:  Bowel sounds present, soft, non-tender, non-distended. No HSM or hernias noted. No rebound or guarding. No masses appreciated  Extremities:  Without edema. Neurologic:  Alert and  oriented x4 Psych:  Normal mood and affect.  Intake/Output from previous day: 02/11 0701 - 02/12 0700 In: 3103.3 [P.O.:100; I.V.:1703.3; IV Piggyback:1300] Out: 650 [Urine:650] Intake/Output this shift: No intake/output data recorded.  Lab Results: Recent Labs    09/14/17 1246 09/15/17 0021 09/15/17 0622  WBC 6.2 6.1 4.7  HGB 13.9 11.0* 10.7*  HCT 43.2 34.3* 33.8*  PLT 425* 371 343   BMET Recent Labs    09/14/17 1246 09/15/17 0622  NA 139 136  K 4.2 3.6  CL 102 105  CO2 26 23  GLUCOSE 94 114*  BUN 11 9  CREATININE 0.94 0.79  CALCIUM 9.1 8.3*   LFT Recent Labs    09/14/17 1246 09/15/17 0622  PROT 7.5 5.6*  ALBUMIN 3.7 2.8*  AST 79* 58*  ALT 123* 93*  ALKPHOS 52 39  BILITOT 0.6 0.7     Studies/Results: Ct Abdomen Pelvis Wo Contrast  Result Date: 09/14/2017 CLINICAL DATA:  Rectal bleeding. EXAM: CT ABDOMEN AND PELVIS WITHOUT CONTRAST TECHNIQUE: Multidetector CT imaging of the abdomen and pelvis was performed following the standard protocol without IV contrast. COMPARISON:  None. FINDINGS: Lower chest: No acute abnormality.  Hepatobiliary: No focal liver abnormality is seen. No gallstones, gallbladder wall thickening, or biliary dilatation. Pancreas: Unremarkable. No pancreatic ductal dilatation or surrounding inflammatory changes. Spleen: Normal in size without focal abnormality. Adrenals/Urinary Tract: Adrenal glands are unremarkable. Kidneys are normal, without renal calculi, focal lesion, or hydronephrosis. Bladder is unremarkable. Stomach/Bowel: The stomach appears normal. The appendix is not clearly visualized. Stool is noted throughout the colon. There is dilatation of the rectum and distal sigmoid colon with wall thickening and surrounding inflammation. There narrowing of the most distal aspect of the rectum, and neoplasm cannot be excluded. Vascular/Lymphatic: No significant vascular findings are present. No enlarged abdominal or pelvic lymph nodes. Reproductive: Prostate is unremarkable. Other: No abdominal wall hernia or abnormality. No abdominopelvic ascites. Musculoskeletal: No acute or significant osseous findings. IMPRESSION: Dilatation and wall thickening of the distal sigmoid colon and proximal rectum is noted with surrounding inflammation, concerning for proctocolitis. Narrowing of the most distal aspect of the rectum is noted, and possible neoplasm or obstruction cannot be excluded. Sigmoidoscopy is recommended for further evaluation. Electronically Signed   By: Marijo Conception, M.D.   On: 09/14/2017 15:19    Assessment: 52 year old male with history of chronic intermittent rectal bleeding, now with increased rectal bleeding and associated diarrhea and tenesmus for the past 2 weeks. CT scan with concern for proctocolitis, narrowing of distal aspect of rectum noted. Rectal exam performed by ED. No prior colonoscopy. Presented to [facility] 1/28 with Hgb 14.5.  Found to have UTI as well, prescribed Cipro.Thus far, Cdiff quick scan negative, and GI pathogen panel has been collected and pending. Clinically improved with  only one small, loose stool overnight mixed with blood. Hgb is down to 10.7 today, 13.9 on admission and 14.5 at [facility].   Will re-order clear liquids. Anticipate colonoscopy with Propofol on 2/13. Could potentially be discharged thereafter pending findings of colonoscopy. Doubt dealing with infectious process. Consider discontinuing antibiotics.   Elevated transaminases: Transaminases normal at [facility] 08/31/17.  Improved today from admission. Endorses alcohol use. Hepatitis panel ordered and pending. Likely multifactorial in setting of acute illness. Would follow as outpatient.   As of note, urine rapid drug screen unable to be analyzed. Discussed with lab, who states the machine was not able to process it. He denies any illicit drug use.   Plan: Clear liquids today Follow CBC Follow LFTs as outpatient with further evaluation as appropriate Await pending hepatitis panel Anticipate colonoscopy on 09/16/17 with Propofol, with hopeful discharge thereafter   Annitta Needs, PhD, ANP-BC [facility]    LOS: 1 day    09/15/2017, 8:11 AM

## 2017-09-15 NOTE — Progress Notes (Signed)
PROGRESS NOTE    Tanner Dudley  EUM:[id number] DOB: 06/02/1966 DOA: 09/14/2017 PCP: Patient, No Pcp Per     Brief Narrative:  52 year old man admitted from home on 2/11 after he noticed some bloody diarrhea.  He was found to have signs of proctocolitis on CT scan and admission is requested.   Assessment & Plan:   Principal Problem:   Rectal bleeding Active Problems:   Diarrhea   Proctocolitis with rectal bleeding   Proctocolitis/hematochezia -Agree with continued antibiotics, currently on Cipro and Flagyl. -C. difficile is negative, stool pathogen panel is pending. -GI is following, unclear if they will perform colonoscopy this admission.  Acute blood loss anemia -Secondary to above. -Hemoglobin is currently 10.7 which is down 3 g since admission, however no indication for acute transfusion.  Transaminitis -Improving. -Check acute hepatitis panel and right upper quadrant ultrasound.   DVT prophylaxis: SCDs Code Status: Full Code Family Communication: Patient only Disposition Plan: Anticipate discharge home over next 24-48 hours  Consultants:   GI, Dr. Oneida Alar  Procedures:   None  Antimicrobials:  Anti-infectives (From admission, onward)   Start     Dose/Rate Route Frequency Ordered Stop   09/15/17 0600  ciprofloxacin (CIPRO) IVPB 400 mg     400 mg 200 mL/hr over 60 Minutes Intravenous Every 12 hours 09/14/17 1823     09/15/17 0100  metroNIDAZOLE (FLAGYL) IVPB 500 mg     500 mg 100 mL/hr over 60 Minutes Intravenous Every 8 hours 09/14/17 1823     09/14/17 1545  ciprofloxacin (CIPRO) IVPB 400 mg     400 mg 200 mL/hr over 60 Minutes Intravenous  Once 09/14/17 1531 09/14/17 1845   09/14/17 1545  metroNIDAZOLE (FLAGYL) IVPB 500 mg     500 mg 100 mL/hr over 60 Minutes Intravenous  Once 09/14/17 1531 09/14/17 1806       Subjective: Feels well, did have some bloody diarrhea again today, denies abdominal cramping.  Objective: Vitals:   09/14/17 1836  09/14/17 2118 09/15/17 0527 09/15/17 1413  BP: 115/88 125/80 119/82 117/76  Pulse: 78 73 72 72  Resp: 19 20 15 16   Temp: 98.3 F (36.8 C) 98.7 F (37.1 C) (!) 97.5 F (36.4 C) 98 F (36.7 C)  TempSrc: Oral Oral Oral Oral  SpO2: 100% 100% 100% 100%  Weight: 64.7 kg (142 lb 10.2 oz)  63.6 kg (140 lb 3.4 oz)   Height: 5\' 8"  (1.727 m)       Intake/Output Summary (Last 24 hours) at 09/15/2017 1605 Last data filed at 09/15/2017 1509 Gross per 24 hour  Intake 3464.59 ml  Output 650 ml  Net 2814.59 ml   Filed Weights   09/14/17 1206 09/14/17 1836 09/15/17 0527  Weight: 66.2 kg (146 lb) 64.7 kg (142 lb 10.2 oz) 63.6 kg (140 lb 3.4 oz)    Examination:  General exam: Alert, awake, oriented x 3 Respiratory system: Clear to auscultation. Respiratory effort normal. Cardiovascular system:RRR. No murmurs, rubs, gallops. Gastrointestinal system: Abdomen is nondistended, soft and nontender. No organomegaly or masses felt. Normal bowel sounds heard. Central nervous system: Alert and oriented. No focal neurological deficits. Extremities: No C/C/E, +pedal pulses Skin: No rashes, lesions or ulcers Psychiatry: Judgement and insight appear normal. Mood & affect appropriate.     Data Reviewed: I have personally reviewed following labs and imaging studies  CBC: Recent Labs  Lab 09/14/17 1246 09/15/17 0021 09/15/17 0622  WBC 6.2 6.1 4.7  HGB 13.9 11.0* 10.7*  HCT 43.2 34.3* 33.8*  MCV 87.4 87.1 87.1  PLT 425* 371 161   Basic Metabolic Panel: Recent Labs  Lab 09/14/17 1246 09/15/17 0622  NA 139 136  K 4.2 3.6  CL 102 105  CO2 26 23  GLUCOSE 94 114*  BUN 11 9  CREATININE 0.94 0.79  CALCIUM 9.1 8.3*   GFR: Estimated Creatinine Clearance: 100.5 mL/min (by C-G formula based on SCr of 0.79 mg/dL). Liver Function Tests: Recent Labs  Lab 09/14/17 1246 09/15/17 0622  AST 79* 58*  ALT 123* 93*  ALKPHOS 52 39  BILITOT 0.6 0.7  PROT 7.5 5.6*  ALBUMIN 3.7 2.8*   No results for  input(s): LIPASE, AMYLASE in the last 168 hours. No results for input(s): AMMONIA in the last 168 hours. Coagulation Profile: No results for input(s): INR, PROTIME in the last 168 hours. Cardiac Enzymes: Recent Labs  Lab 09/15/17 0021 09/15/17 0622 09/15/17 1204  TROPONINI <0.03 <0.03 <0.03   BNP (last 3 results) No results for input(s): PROBNP in the last 8760 hours. HbA1C: No results for input(s): HGBA1C in the last 72 hours. CBG: No results for input(s): GLUCAP in the last 168 hours. Lipid Profile: No results for input(s): CHOL, HDL, LDLCALC, TRIG, CHOLHDL, LDLDIRECT in the last 72 hours. Thyroid Function Tests: Recent Labs    09/15/17 0021  TSH 1.234   Anemia Panel: No results for input(s): VITAMINB12, FOLATE, FERRITIN, TIBC, IRON, RETICCTPCT in the last 72 hours. Urine analysis: No results found for: COLORURINE, APPEARANCEUR, LABSPEC, PHURINE, GLUCOSEU, HGBUR, BILIRUBINUR, KETONESUR, PROTEINUR, UROBILINOGEN, NITRITE, LEUKOCYTESUR Sepsis Labs: @LABRCNTIP (procalcitonin:4,lacticidven:4)  ) Recent Results (from the past 240 hour(s))  Gastrointestinal Panel by PCR , Stool     Status: Abnormal   Collection Time: 09/14/17  1:45 PM  Result Value Ref Range Status   Campylobacter species NOT DETECTED NOT DETECTED Final   Plesimonas shigelloides NOT DETECTED NOT DETECTED Final   Salmonella species NOT DETECTED NOT DETECTED Final   Yersinia enterocolitica NOT DETECTED NOT DETECTED Final   Vibrio species NOT DETECTED NOT DETECTED Final   Vibrio cholerae NOT DETECTED NOT DETECTED Final   Enteroaggregative E coli (EAEC) NOT DETECTED NOT DETECTED Final   Enteropathogenic E coli (EPEC) NOT DETECTED NOT DETECTED Final   Enterotoxigenic E coli (ETEC) NOT DETECTED NOT DETECTED Final   Shiga like toxin producing E coli (STEC) NOT DETECTED NOT DETECTED Final   E. coli O157 NONE (A) NOT DETECTED Final   Shigella/Enteroinvasive E coli (EIEC) NOT DETECTED NOT DETECTED Final    Cryptosporidium NOT DETECTED NOT DETECTED Final   Cyclospora cayetanensis NOT DETECTED NOT DETECTED Final   Entamoeba histolytica NOT DETECTED NOT DETECTED Final   Giardia lamblia NOT DETECTED NOT DETECTED Final   Adenovirus F40/41 NOT DETECTED NOT DETECTED Final   Astrovirus NOT DETECTED NOT DETECTED Final   Norovirus GI/GII NOT DETECTED NOT DETECTED Final   Rotavirus A NOT DETECTED NOT DETECTED Final   Sapovirus (I, II, IV, and V) NOT DETECTED NOT DETECTED Final    Comment: Performed at [facility], [address] [place], [place] [address]  C difficile quick scan w PCR reflex     Status: None   Collection Time: 09/14/17  1:45 PM  Result Value Ref Range Status   C Diff antigen NEGATIVE NEGATIVE Final   C Diff toxin NEGATIVE NEGATIVE Final   C Diff interpretation No C. difficile detected.  Final    Comment: Performed at [facility], [address], [place], [place] [address]  Radiology Studies: Ct Abdomen Pelvis Wo Contrast  Result Date: 09/14/2017 CLINICAL DATA:  Rectal bleeding. EXAM: CT ABDOMEN AND PELVIS WITHOUT CONTRAST TECHNIQUE: Multidetector CT imaging of the abdomen and pelvis was performed following the standard protocol without IV contrast. COMPARISON:  None. FINDINGS: Lower chest: No acute abnormality. Hepatobiliary: No focal liver abnormality is seen. No gallstones, gallbladder wall thickening, or biliary dilatation. Pancreas: Unremarkable. No pancreatic ductal dilatation or surrounding inflammatory changes. Spleen: Normal in size without focal abnormality. Adrenals/Urinary Tract: Adrenal glands are unremarkable. Kidneys are normal, without renal calculi, focal lesion, or hydronephrosis. Bladder is unremarkable. Stomach/Bowel: The stomach appears normal. The appendix is not clearly visualized. Stool is noted throughout the colon. There is dilatation of the rectum and distal sigmoid colon with wall thickening and surrounding inflammation. There narrowing of  the most distal aspect of the rectum, and neoplasm cannot be excluded. Vascular/Lymphatic: No significant vascular findings are present. No enlarged abdominal or pelvic lymph nodes. Reproductive: Prostate is unremarkable. Other: No abdominal wall hernia or abnormality. No abdominopelvic ascites. Musculoskeletal: No acute or significant osseous findings. IMPRESSION: Dilatation and wall thickening of the distal sigmoid colon and proximal rectum is noted with surrounding inflammation, concerning for proctocolitis. Narrowing of the most distal aspect of the rectum is noted, and possible neoplasm or obstruction cannot be excluded. Sigmoidoscopy is recommended for further evaluation. Electronically Signed   By: Marijo Conception, M.D.   On: 09/14/2017 15:19        Scheduled Meds: . [START ON 09/16/2017] polyethylene glycol-electrolytes  2,000 mL Oral Once   Continuous Infusions: . 0.9 % NaCl with KCl 20 mEq / L 75 mL/hr at 09/15/17 1420  . ciprofloxacin Stopped (09/15/17 8921)  . metronidazole Stopped (09/15/17 1206)     LOS: 1 day    Time spent: 25 minutes.     Lelon Frohlich, MD [facility] Pager [phone]  If 7PM-7AM, please contact night-coverage [email or web address] Password [facility] 09/15/2017, 4:05 PM

## 2017-09-15 NOTE — Progress Notes (Signed)
Pt signed consent form for colonoscopy but was very skeptical stating that no one told him about any bad things that could happen. Pt stated he was only told he was getting a colonoscopy but nothing else was explained to him about how it works or effects it can have on your body. This RN will pass on to day shift for GI to hopefully talk to pt before procedure.

## 2017-09-16 ENCOUNTER — Inpatient Hospital Stay (HOSPITAL_COMMUNITY): Payer: Medicaid Other | Admitting: Anesthesiology

## 2017-09-16 ENCOUNTER — Encounter (HOSPITAL_COMMUNITY)
Admission: EM | Disposition: A | Discharge status: Home / Self Care | Payer: Self-pay | Source: Home / Self Care | Attending: Internal Medicine

## 2017-09-16 ENCOUNTER — Inpatient Hospital Stay (HOSPITAL_COMMUNITY): Payer: Medicaid Other

## 2017-09-16 ENCOUNTER — Encounter (HOSPITAL_COMMUNITY): Payer: Self-pay | Admitting: *Deleted

## 2017-09-16 DIAGNOSIS — C20 Malignant neoplasm of rectum: Secondary | ICD-10-CM

## 2017-09-16 DIAGNOSIS — R933 Abnormal findings on diagnostic imaging of other parts of digestive tract: Secondary | ICD-10-CM

## 2017-09-16 HISTORY — PX: COLONOSCOPY WITH PROPOFOL: SHX5780

## 2017-09-16 HISTORY — PX: BIOPSY: SHX5522

## 2017-09-16 LAB — CBC
HCT: 34 % — ABNORMAL LOW (ref 39.0–52.0)
Hemoglobin: 10.7 g/dL — ABNORMAL LOW (ref 13.0–17.0)
MCH: 27.4 pg (ref 26.0–34.0)
MCHC: 31.5 g/dL (ref 30.0–36.0)
MCV: 87.2 fL (ref 78.0–100.0)
PLATELETS: 365 10*3/uL (ref 150–400)
RBC: 3.9 MIL/uL — ABNORMAL LOW (ref 4.22–5.81)
RDW: 15.3 % (ref 11.5–15.5)
WBC: 4.3 10*3/uL (ref 4.0–10.5)

## 2017-09-16 LAB — COMPREHENSIVE METABOLIC PANEL
ALK PHOS: 38 U/L (ref 38–126)
ALT: 77 U/L — AB (ref 17–63)
AST: 42 U/L — AB (ref 15–41)
Albumin: 2.8 g/dL — ABNORMAL LOW (ref 3.5–5.0)
Anion gap: 8 (ref 5–15)
BILIRUBIN TOTAL: 0.5 mg/dL (ref 0.3–1.2)
BUN: 5 mg/dL — AB (ref 6–20)
CALCIUM: 8.5 mg/dL — AB (ref 8.9–10.3)
CO2: 23 mmol/L (ref 22–32)
CREATININE: 0.78 mg/dL (ref 0.61–1.24)
Chloride: 107 mmol/L (ref 101–111)
GFR calc Af Amer: >60 mL/min (ref >60)
Glucose, Bld: 82 mg/dL (ref 65–99)
POTASSIUM: 3.7 mmol/L (ref 3.5–5.1)
Sodium: 138 mmol/L (ref 135–145)
TOTAL PROTEIN: 5.8 g/dL — AB (ref 6.5–8.1)

## 2017-09-16 LAB — HEPATITIS PANEL, ACUTE
HCV Ab: 0.1 s/co ratio (ref 0.0–0.9)
HCV Ab: <0.1 s/co ratio (ref 0.0–0.9)
Hep A IgM: NEGATIVE
Hep A IgM: NEGATIVE
Hep B C IgM: NEGATIVE
Hep B C IgM: NEGATIVE
Hepatitis B Surface Ag: NEGATIVE
Hepatitis B Surface Ag: NEGATIVE

## 2017-09-16 LAB — HIV ANTIBODY (ROUTINE TESTING W REFLEX): HIV SCREEN 4TH GENERATION: NONREACTIVE

## 2017-09-16 SURGERY — COLONOSCOPY WITH PROPOFOL
Anesthesia: Monitor Anesthesia Care

## 2017-09-16 MED ORDER — PROPOFOL 10 MG/ML IV BOLUS
INTRAVENOUS | Status: AC
  Filled 2017-09-16: qty 40

## 2017-09-16 MED ORDER — LACTATED RINGERS IV SOLN
INTRAVENOUS | Status: DC
  Administered 2017-09-16: 15:00:00 via INTRAVENOUS

## 2017-09-16 MED ORDER — LIDOCAINE VISCOUS 2 % MT SOLN
OROMUCOSAL | Status: AC
  Filled 2017-09-16: qty 15

## 2017-09-16 MED ORDER — SIMETHICONE 40 MG/0.6ML PO SUSP
ORAL | Status: AC
  Filled 2017-09-16: qty 1.2

## 2017-09-16 MED ORDER — FENTANYL CITRATE (PF) 250 MCG/5ML IJ SOLN
INTRAMUSCULAR | Status: AC
  Filled 2017-09-16: qty 5

## 2017-09-16 MED ORDER — FENTANYL CITRATE (PF) 100 MCG/2ML IJ SOLN
25.0000 ug | Freq: Once | INTRAMUSCULAR | Status: AC
  Administered 2017-09-16: 25 ug via INTRAVENOUS

## 2017-09-16 MED ORDER — LIDOCAINE HCL (PF) 1 % IJ SOLN
INTRAMUSCULAR | Status: AC
  Filled 2017-09-16: qty 5

## 2017-09-16 MED ORDER — MIDAZOLAM HCL 2 MG/2ML IJ SOLN
INTRAMUSCULAR | Status: AC
  Filled 2017-09-16: qty 2

## 2017-09-16 MED ORDER — PROPOFOL 500 MG/50ML IV EMUL
INTRAVENOUS | Status: DC | PRN
  Administered 2017-09-16: 15:00:00 via INTRAVENOUS
  Administered 2017-09-16: 200 ug/kg/min via INTRAVENOUS
  Administered 2017-09-16: 16:00:00 via INTRAVENOUS

## 2017-09-16 MED ORDER — FENTANYL CITRATE (PF) 100 MCG/2ML IJ SOLN
INTRAMUSCULAR | Status: DC | PRN
  Administered 2017-09-16: 25 ug via INTRAVENOUS

## 2017-09-16 MED ORDER — FENTANYL CITRATE (PF) 100 MCG/2ML IJ SOLN
INTRAMUSCULAR | Status: AC
  Filled 2017-09-16: qty 2

## 2017-09-16 MED ORDER — MIDAZOLAM HCL 2 MG/2ML IJ SOLN
1.0000 mg | INTRAMUSCULAR | Status: AC
  Administered 2017-09-16: 2 mg via INTRAVENOUS

## 2017-09-16 NOTE — Progress Notes (Signed)
PROGRESS NOTE    Tanner Dudley  ZOX:[id number] DOB: 08/18/1965 DOA: 09/14/2017 PCP: Patient, No Pcp Per   Brief Narrative:   This is a 52 year old male who was admitted with proctocolitis on 2/11 with symptoms of hematochezia.  He is currently on IV ciprofloxacin and Flagyl.  GI plans for colonoscopy today.  He has been noted to have a mild transaminitis for which he is undergoing workup with a right upper quadrant ultrasound as well as acute hepatitis panel.  Assessment & Plan:   Principal Problem:   Rectal bleeding Active Problems:   Diarrhea   Proctocolitis with rectal bleeding   1. Proctocolitis with hematochezia.  Continue IV ciprofloxacin and Flagyl.  Appreciate GI evaluation with colonoscopy.  Stool pathogen panel is negative along with C. difficile which was also negative. 2. Acute blood loss anemia secondary to above.  Hemoglobin remained stable with no overt bleeding currently identified.  No indication for transfusion at this time.  Repeat CBC in a.m. 3. Mild transaminitis-improving.  Await results of acute hepatitis panel as well as right upper quadrant ultrasound.   DVT prophylaxis:SCDs Code Status: Full Family Communication: None Disposition Plan: Anticipate DC in next 1-2 days   Consultants:   GI-Dr. Oneida Alar  Procedures:   Colonoscopy pending 2/13  Antimicrobials:   IV Ciprofloxacin 2/12->  IV Flagyl 2/12->   Subjective: Patient seen and evaluated today with no new acute complaints or concerns.  He states that his abdominal pain has improved and he has had no further overt bleeding overnight.  He has tolerated colon prep well yesterday with plans for colonoscopy today.  No acute concerns or events noted overnight.  Objective: Vitals:   09/15/17 0527 09/15/17 1413 09/15/17 2127 09/16/17 0500  BP: 119/82 117/76 110/74 (!) 132/93  Pulse: 72 72 70 68  Resp: 15 16 16 16   Temp: (!) 97.5 F (36.4 C) 98 F (36.7 C) 98.4 F (36.9 C) 97.6 F (36.4 C)    TempSrc: Oral Oral Oral Oral  SpO2: 100% 100% 100% 100%  Weight: 63.6 kg (140 lb 3.4 oz)   63.6 kg (140 lb 3.4 oz)  Height:        Intake/Output Summary (Last 24 hours) at 09/16/2017 1021 Last data filed at 09/16/2017 0535 Gross per 24 hour  Intake 3206.25 ml  Output 550 ml  Net 2656.25 ml   Filed Weights   09/14/17 1836 09/15/17 0527 09/16/17 0500  Weight: 64.7 kg (142 lb 10.2 oz) 63.6 kg (140 lb 3.4 oz) 63.6 kg (140 lb 3.4 oz)    Examination:  General exam: Appears calm and comfortable  Respiratory system: Clear to auscultation. Respiratory effort normal. Cardiovascular system: S1 & S2 heard, RRR. No JVD, murmurs, rubs, gallops or clicks. No pedal edema. Gastrointestinal system: Abdomen is nondistended, soft and nontender. No organomegaly or masses felt. Normal bowel sounds heard. Central nervous system: Alert and oriented. No focal neurological deficits. Extremities: Symmetric 5 x 5 power. Skin: No rashes, lesions or ulcers Psychiatry: Judgement and insight appear normal. Mood & affect appropriate.     Data Reviewed: I have personally reviewed following labs and imaging studies  CBC: Recent Labs  Lab 09/14/17 1246 09/15/17 0021 09/15/17 0622 09/16/17 0350  WBC 6.2 6.1 4.7 4.3  HGB 13.9 11.0* 10.7* 10.7*  HCT 43.2 34.3* 33.8* 34.0*  MCV 87.4 87.1 87.1 87.2  PLT 425* 371 343 811   Basic Metabolic Panel: Recent Labs  Lab 09/14/17 1246 09/15/17 0622 09/16/17 0350  NA 139 136 138  K 4.2 3.6 3.7  CL 102 105 107  CO2 26 23 23   GLUCOSE 94 114* 82  BUN 11 9 5*  CREATININE 0.94 0.79 0.78  CALCIUM 9.1 8.3* 8.5*   GFR: Estimated Creatinine Clearance: 100.5 mL/min (by C-G formula based on SCr of 0.78 mg/dL). Liver Function Tests: Recent Labs  Lab 09/14/17 1246 09/15/17 0622 09/16/17 0350  AST 79* 58* 42*  ALT 123* 93* 77*  ALKPHOS 52 39 38  BILITOT 0.6 0.7 0.5  PROT 7.5 5.6* 5.8*  ALBUMIN 3.7 2.8* 2.8*   No results for input(s): LIPASE, AMYLASE in the  last 168 hours. No results for input(s): AMMONIA in the last 168 hours. Coagulation Profile: No results for input(s): INR, PROTIME in the last 168 hours. Cardiac Enzymes: Recent Labs  Lab 09/15/17 0021 09/15/17 0622 09/15/17 1204  TROPONINI <0.03 <0.03 <0.03   BNP (last 3 results) No results for input(s): PROBNP in the last 8760 hours. HbA1C: No results for input(s): HGBA1C in the last 72 hours. CBG: No results for input(s): GLUCAP in the last 168 hours. Lipid Profile: No results for input(s): CHOL, HDL, LDLCALC, TRIG, CHOLHDL, LDLDIRECT in the last 72 hours. Thyroid Function Tests: Recent Labs    09/15/17 0021  TSH 1.234   Anemia Panel: No results for input(s): VITAMINB12, FOLATE, FERRITIN, TIBC, IRON, RETICCTPCT in the last 72 hours. Sepsis Labs: No results for input(s): PROCALCITON, LATICACIDVEN in the last 168 hours.  Recent Results (from the past 240 hour(s))  Gastrointestinal Panel by PCR , Stool     Status: Abnormal   Collection Time: 09/14/17  1:45 PM  Result Value Ref Range Status   Campylobacter species NOT DETECTED NOT DETECTED Final   Plesimonas shigelloides NOT DETECTED NOT DETECTED Final   Salmonella species NOT DETECTED NOT DETECTED Final   Yersinia enterocolitica NOT DETECTED NOT DETECTED Final   Vibrio species NOT DETECTED NOT DETECTED Final   Vibrio cholerae NOT DETECTED NOT DETECTED Final   Enteroaggregative E coli (EAEC) NOT DETECTED NOT DETECTED Final   Enteropathogenic E coli (EPEC) NOT DETECTED NOT DETECTED Final   Enterotoxigenic E coli (ETEC) NOT DETECTED NOT DETECTED Final   Shiga like toxin producing E coli (STEC) NOT DETECTED NOT DETECTED Final   E. coli O157 NONE (A) NOT DETECTED Final   Shigella/Enteroinvasive E coli (EIEC) NOT DETECTED NOT DETECTED Final   Cryptosporidium NOT DETECTED NOT DETECTED Final   Cyclospora cayetanensis NOT DETECTED NOT DETECTED Final   Entamoeba histolytica NOT DETECTED NOT DETECTED Final   Giardia lamblia NOT  DETECTED NOT DETECTED Final   Adenovirus F40/41 NOT DETECTED NOT DETECTED Final   Astrovirus NOT DETECTED NOT DETECTED Final   Norovirus GI/GII NOT DETECTED NOT DETECTED Final   Rotavirus A NOT DETECTED NOT DETECTED Final   Sapovirus (I, II, IV, and V) NOT DETECTED NOT DETECTED Final    Comment: Performed at [facility], [address] [place], [place] [address]  C difficile quick scan w PCR reflex     Status: None   Collection Time: 09/14/17  1:45 PM  Result Value Ref Range Status   C Diff antigen NEGATIVE NEGATIVE Final   C Diff toxin NEGATIVE NEGATIVE Final   C Diff interpretation No C. difficile detected.  Final    Comment: Performed at [facility], [address] [place], [place] [address]         Radiology Studies: Ct Abdomen Pelvis Wo Contrast  Result Date: 09/14/2017 CLINICAL DATA:  Rectal bleeding. EXAM:  CT ABDOMEN AND PELVIS WITHOUT CONTRAST TECHNIQUE: Multidetector CT imaging of the abdomen and pelvis was performed following the standard protocol without IV contrast. COMPARISON:  None. FINDINGS: Lower chest: No acute abnormality. Hepatobiliary: No focal liver abnormality is seen. No gallstones, gallbladder wall thickening, or biliary dilatation. Pancreas: Unremarkable. No pancreatic ductal dilatation or surrounding inflammatory changes. Spleen: Normal in size without focal abnormality. Adrenals/Urinary Tract: Adrenal glands are unremarkable. Kidneys are normal, without renal calculi, focal lesion, or hydronephrosis. Bladder is unremarkable. Stomach/Bowel: The stomach appears normal. The appendix is not clearly visualized. Stool is noted throughout the colon. There is dilatation of the rectum and distal sigmoid colon with wall thickening and surrounding inflammation. There narrowing of the most distal aspect of the rectum, and neoplasm cannot be excluded. Vascular/Lymphatic: No significant vascular findings are present. No enlarged abdominal or pelvic lymph nodes.  Reproductive: Prostate is unremarkable. Other: No abdominal wall hernia or abnormality. No abdominopelvic ascites. Musculoskeletal: No acute or significant osseous findings. IMPRESSION: Dilatation and wall thickening of the distal sigmoid colon and proximal rectum is noted with surrounding inflammation, concerning for proctocolitis. Narrowing of the most distal aspect of the rectum is noted, and possible neoplasm or obstruction cannot be excluded. Sigmoidoscopy is recommended for further evaluation. Electronically Signed   By: Marijo Conception, M.D.   On: 09/14/2017 15:19        Scheduled Meds: Continuous Infusions: . 0.9 % NaCl with KCl 20 mEq / L 75 mL/hr at 09/16/17 0617  . ciprofloxacin Stopped (09/16/17 0744)  . metronidazole 500 mg (09/16/17 0844)     LOS: 2 days    Time spent: 30 minutes    Francesa Eugenio Darleen Crocker, DO [facility] Pager [phone]  If 7PM-7AM, please contact night-coverage [email or web address] Password [facility] 09/16/2017, 10:21 AM

## 2017-09-16 NOTE — Transfer of Care (Signed)
Immediate Anesthesia Transfer of Care Note  Patient: Tanner Dudley  Procedure(s) Performed: COLONOSCOPY WITH PROPOFOL (N/A ) BIOPSY  Patient Location: PACU  Anesthesia Type:MAC  Level of Consciousness: awake and alert   Airway & Oxygen Therapy: Patient Spontanous Breathing  Post-op Assessment: Report given to RN and Post -op Vital signs reviewed and stable  Post vital signs: Reviewed and stable  Last Vitals:  Vitals:   09/15/17 2127 09/16/17 0500  BP: 110/74 (!) 132/93  Pulse: 70 68  Resp: 16 16  Temp: 36.9 C 36.4 C  SpO2: 100% 100%    Last Pain:  Vitals:   09/16/17 1509  TempSrc:   PainSc: 0-No pain      Patients Stated Pain Goal: 6 (05/11/11 1975)  Complications: No apparent anesthesia complications

## 2017-09-16 NOTE — Anesthesia Preprocedure Evaluation (Addendum)
Anesthesia Evaluation  Patient identified by MRN, date of birth, ID band Patient awake    Reviewed: Allergy & Precautions, NPO status , Patient's Chart, lab work & pertinent test results  Airway Mallampati: I  TM Distance: >3 FB Neck ROM: Full    Dental  (+) Teeth Intact   Pulmonary Current Smoker,    breath sounds clear to auscultation       Cardiovascular negative cardio ROS   Rhythm:Regular Rate:Normal     Neuro/Psych negative neurological ROS  negative psych ROS   GI/Hepatic (+)     substance abuse ( 2-3 beers, 3-4 times per week )  alcohol use, Elevated transaminases   Endo/Other  negative endocrine ROS  Renal/GU negative Renal ROS     Musculoskeletal   Abdominal   Peds  Hematology negative hematology ROS (+)   Anesthesia Other Findings   Reproductive/Obstetrics                            Anesthesia Physical Anesthesia Plan  ASA: II  Anesthesia Plan: MAC   Post-op Pain Management:    Induction: Intravenous  PONV Risk Score and Plan:   Airway Management Planned: Simple Face Mask  Additional Equipment:   Intra-op Plan:   Post-operative Plan:   Informed Consent: I have reviewed the patients History and Physical, chart, labs and discussed the procedure including the risks, benefits and alternatives for the proposed anesthesia with the patient or authorized representative who has indicated his/her understanding and acceptance.     Plan Discussed with:   Anesthesia Plan Comments:         Anesthesia Quick Evaluation

## 2017-09-16 NOTE — Progress Notes (Signed)
Spoke with patient regarding colonoscopy.  I have discussed the risks, alternatives, benefits with regards to but not limited to the risk of reaction to medication, bleeding, infection, perforation and the patient is agreeable to proceed. Written consent to be obtained.  Laureen Ochs. Bernarda Caffey [facility] [phone] 2/13/20199:10 AM

## 2017-09-16 NOTE — Op Note (Signed)
[facility] Patient Name: Tanner Dudley Procedure Date: 09/16/2017 11:26 AM MRN: [id number] Date of Birth: February 12, 1966 Attending MD: Norvel Richards , MD CSN: [id number] Age: 52 Admit Type: Inpatient Procedure:                Colonoscopy Indications:              Hematochezia, Abnormal CT of the GI tract Providers:                Norvel Richards, MD, Lurline Del, RN, Aram Candela Referring MD:              Medicines:                Propofol per Anesthesia Complications:            No immediate complications. Estimated Blood Loss:     Estimated blood loss: 9 mL Procedure:                Pre-Anesthesia Assessment:                           - Prior to the procedure, a History and Physical                            was performed, and patient medications and                            allergies were reviewed. The patient's tolerance of                            previous anesthesia was also reviewed. The risks                            and benefits of the procedure and the sedation                            options and risks were discussed with the patient.                            All questions were answered, and informed consent                            was obtained. Prior Anticoagulants: The patient has                            taken no previous anticoagulant or antiplatelet                            agents. ASA Grade Assessment: III - A patient with                            severe systemic disease. After reviewing the risks  and benefits, the patient was deemed in                            satisfactory condition to undergo the procedure.                           After obtaining informed consent, the colonoscope                            was passed under direct vision. Throughout the                            procedure, the patient's blood pressure, pulse, and                            oxygen saturations  were monitored continuously. The                            EC-[id number] ([id number]) scope was introduced through                            the and advanced to the the cecum, identified by                            appendiceal orifice and ileocecal valve. The                            colonoscopy was somewhat difficult due to a                            partially obstructing mass. The patient tolerated                            the procedure well. The quality of the bowel                            preparation was adequate. The ileocecal valve,                            appendiceal orifice, and rectum were photographed.                            The entire colon was well visualized. The ileocecal                            valve, appendiceal orifice, and rectum were                            photographed. The entire colon was well visualized.                            The colonoscopy was somewhat difficult due to a  partially obstructing mass. The patient tolerated                            the procedure well. The quality of the bowel                            preparation was adequate. Scope In: 3:23:08 PM Scope Out: 3:43:50 PM Scope Withdrawal Time: 0 hours 12 minutes 0 seconds  Total Procedure Duration: 0 hours 20 minutes 42 seconds  Findings:      The perianal exam was abnormal. See attached photographs. 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 DRE revealed a bulky circumferential long Mass with Narrowing       of the Lumen Extending to the proximal reach of the Examiner's Finger.       It Would Not Admit the adult scope. I was able to advance the Ultra-slim       colonoscope through the stenosis to complete the examination. 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 Tumor Extended a good 6-7 cm up       into the  Rectum. Retroflexion view confirmed a large bulky       circumferential tumor involving the distal rectum.      The colon upstream from the rectal mass, all the way to the cecum,       appeared normal aside from slight dilation. Biopsies of of the tumor       taken for histologic study. Impression:               - Abnormal perianal exam. 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 The lower GI tract upstream of                            the rectal tumor appeared entirely normal aside                            from slight dilation.                           Multiple biopsy specimens obtained. Moderate Sedation:      Moderate (conscious) sedation was personally administered by an       anesthesia professional. The following parameters were monitored: oxygen       saturation, heart rate, blood pressure, respiratory rate, EKG, adequacy       of pulmonary ventilation, and response to care. Total physician       intraservice time was 33 minutes. Recommendation:           -  Return patient to hospital ward for ongoing care.                            Oncology consultation. I have discontinued IV Cipro                            and Flagyl. Follow up on pathology.                           - Clear liquid diet.                           - Repeat colonoscopy date to be determined after                            pending pathology results are reviewed for                            surveillance based on pathology results.                           - Continue present medications. Procedure Code(s):        --- Professional ---                           [id number], Colonoscopy, flexible; diagnostic, including                            collection of specimen(s) by brushing or washing,                            when performed  (separate procedure) Diagnosis Code(s):        --- Professional ---                           K92.1, Melena (includes Hematochezia)                           R93.3, Abnormal findings on diagnostic imaging of                            other parts of digestive tract CPT copyright 2016 American Medical Association. All rights reserved. The codes documented in this report are preliminary and upon coder review may  be revised to meet current compliance requirements. Cristopher Estimable. Rourk, MD Norvel Richards, MD 09/16/2017 4:09:58 PM This report has been signed electronically. Number of Addenda: 0

## 2017-09-16 NOTE — Progress Notes (Signed)
RN went in pts room to remind him of the enema he was to receive this am around 6:30. Pt stated he did not want enema because the Go-lytely is making him have loose diarrhea non stop since he restarted drinking at 4am. Pt is sitting on BSC and scared to get off of it d/t having an accident. RN educated pt on enema order to make sure he is clear for his colonoscopy. Pt stated, "My gut will be clear. I have been crapping for an hour and a half straight." Will pass on to day nurse and continue to monitor pt

## 2017-09-16 NOTE — Anesthesia Postprocedure Evaluation (Signed)
Anesthesia Post Note  Patient: Tanner Dudley  Procedure(s) Performed: COLONOSCOPY WITH PROPOFOL (N/A ) BIOPSY  Patient location during evaluation: PACU Anesthesia Type: MAC Level of consciousness: awake and alert and oriented Pain management: pain level controlled Vital Signs Assessment: post-procedure vital signs reviewed and stable Respiratory status: spontaneous breathing Cardiovascular status: blood pressure returned to baseline Postop Assessment: no apparent nausea or vomiting Anesthetic complications: no     Last Vitals:  Vitals:   09/16/17 1600 09/16/17 1615  BP: (!) 152/105 (!) 151/103  Pulse: 78 65  Resp: (!) 24 (!) 23  Temp: 36.5 C   SpO2: 100% 100%    Last Pain:  Vitals:   09/16/17 1509  TempSrc:   PainSc: 0-No pain                 Julyssa Kyer

## 2017-09-17 ENCOUNTER — Telehealth: Payer: Self-pay | Admitting: Internal Medicine

## 2017-09-17 DIAGNOSIS — K629 Disease of anus and rectum, unspecified: Secondary | ICD-10-CM

## 2017-09-17 DIAGNOSIS — K6289 Other specified diseases of anus and rectum: Secondary | ICD-10-CM

## 2017-09-17 LAB — CBC
HEMATOCRIT: 32.6 % — AB (ref 39.0–52.0)
HEMOGLOBIN: 10.4 g/dL — AB (ref 13.0–17.0)
MCH: 27.7 pg (ref 26.0–34.0)
MCHC: 31.9 g/dL (ref 30.0–36.0)
MCV: 86.7 fL (ref 78.0–100.0)
PLATELETS: 348 10*3/uL (ref 150–400)
RBC: 3.76 MIL/uL — AB (ref 4.22–5.81)
RDW: 15.1 % (ref 11.5–15.5)
WBC: 4.7 10*3/uL (ref 4.0–10.5)

## 2017-09-17 LAB — BASIC METABOLIC PANEL
ANION GAP: 9 (ref 5–15)
BUN: <5 mg/dL — ABNORMAL LOW (ref 6–20)
CO2: 23 mmol/L (ref 22–32)
Calcium: 8.3 mg/dL — ABNORMAL LOW (ref 8.9–10.3)
Chloride: 107 mmol/L (ref 101–111)
Creatinine, Ser: 0.79 mg/dL (ref 0.61–1.24)
GFR calc Af Amer: >60 mL/min (ref >60)
Glucose, Bld: 75 mg/dL (ref 65–99)
POTASSIUM: 3.6 mmol/L (ref 3.5–5.1)
Sodium: 139 mmol/L (ref 135–145)

## 2017-09-17 NOTE — Care Management (Signed)
Patient Information   Patient Name Tanner Dudley, Tanner Dudley ([id number]) Sex Male DOB 13-Oct-1965  Room Bed  A302 A302-01  Patient Demographics   Address [place] [address] Phone [phone] (Home)  Patient Ethnicity & Race   Ethnic Group Patient Race  Not Hispanic or Latino Black or African American  Emergency Contact(s)   Name Relation Home Work Mobile  moore,linda Sister [phone]    moore,clifton Relative [phone]    Documents on File    Status Date Received Description  Documents for the Patient  [place] Not Received    [place] E-Signature HIPAA Notice of Privacy Signed 37/34/28   Driver's License Not Received    Insurance Card Not Received    Advance Directives/Living Will/HCPOA/POA Not Received    Other Photo ID Not Received    Release of Information Received 09/15/17   Patient Photo   Photo of Patient  Documents for the Encounter  AOB (Assignment of Insurance Benefits) Not Received    E-signature AOB Signed 09/14/17   MEDICARE RIGHTS Not Received    E-signature Medicare Rights     Cardiac Monitoring Strip Shift Summary Received 09/14/17   ED Patient Billing Extract   ED PB Billing Extract  Ultrasound Received 09/16/17   After Visit Summary   IP After Visit Summary  Colonoscopy Electronic Received 09/16/17   Admission Information   Attending Provider Admitting Provider Admission Type Admission Date/Time  Rodena Goldmann, DO Jani Gravel, MD Emergency 09/14/17 1322  Discharge Date Hospital Service Auth/Cert Status Service Area   Internal Medicine Incomplete [place]  Unit Room/Bed Admission Status   AP-DEPT 300 A302/A302-01 Admission (Confirmed)   Admission   Complaint  rectal bleeding  Hospital Account   Name Acct ID Class Status Primary Coverage  Spenser, Harren [id number] Inpatient Open MEDICAID POTENTIAL - MEDICAID POTENTIAL      Guarantor Account (for Hospital Account [id number])    Name Relation to [place]? Acct Type  Sharlyne Pacas Self [employer] Yes Personal/Family  Address Phone    [address] [place], [place] [address] [phone])        Coverage Information (for Hospital Account [id number])   F/O Payor/Plan Precert #  MEDICAID POTENTIAL/MEDICAID POTENTIAL   Subscriber Subscriber #  [place], Baba [id number]  Address Phone

## 2017-09-17 NOTE — Addendum Note (Signed)
Addendum  created 09/17/17 1229 by Vista Deck, CRNA   Sign clinical note

## 2017-09-17 NOTE — Plan of Care (Signed)
  Education: Knowledge of General Education information will improve 09/17/2017 0041 - Progressing by Cassandria Anger, RN   Clinical Measurements: Ability to maintain clinical measurements within normal limits will improve 09/17/2017 0041 - Progressing by Cassandria Anger, RN   Activity: Risk for activity intolerance will decrease 09/17/2017 0041 - Progressing by Cassandria Anger, RN   Coping: Level of anxiety will decrease 09/17/2017 0041 - Progressing by Cassandria Anger, RN   Pain Managment: General experience of comfort will improve 09/17/2017 0041 - Progressing by Cassandria Anger, RN   Safety: Ability to remain free from injury will improve 09/17/2017 0041 - Progressing by Cassandria Anger, RN

## 2017-09-17 NOTE — Telephone Encounter (Signed)
[phone] patient brother called and stated that Tanner Dudley consulted with the patient in the hospital and he had a few questions

## 2017-09-17 NOTE — Discharge Summary (Addendum)
Physician Discharge Summary  Tanner Dudley TGG:[id number] DOB: 06-Sep-1965 DOA: 09/14/2017  PCP: Patient, No Pcp Per  Admit date: 09/14/2017  Discharge date: 09/17/2017  Admitted From:Home  Disposition:  Home  Recommendations for Outpatient Follow-up:  1. Follow up with new PCP in 1 month 2. Follow up at [facility] with Dr. Walden Field next week  Home Health:N/A  Equipment/Devices:N/A  Discharge Condition:Stable  CODE STATUS: Full  Diet recommendation: Regular  Brief/Interim Summary: This is a 52 year old male who was sent 2/11 with symptoms of hematochezia.  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  He was noted to have a mild transaminitis which has cleared and workup with right upper quadrant ultrasound as well as panel have been within normal limits.  He is currently able to tolerate a diet and will follow up with Dr. Walden Field at the cancer center per discussion with Dr. Gala Romney.  He continues to have some rectal bleed which is to be expected after his colonoscopy according to GI.   Discharge Diagnoses:  Principal Problem:   Rectal bleeding Active Problems:   Diarrhea   Proctocolitis with rectal bleeding   Rectal mass  1. Hematochezia with anorectal tumor.  Biopsies have been sent to pathology which are currently pending and will need follow-up at the cancer center.  Dr. Walden Field will follow-up in the next week to review pathology.  Dr. Gala Romney of GI will call patient to check in, in the near future. 2. Mild transaminitis-resolved.  Negative workup. 3. Acute blood loss anemia-stable with no need for transfusion.  Discharge Instructions  Discharge Instructions    Call MD for:  severe uncontrolled pain   Complete by:  As directed    Call MD for:  temperature >100.4   Complete by:  As directed    Diet - low sodium heart  healthy   Complete by:  As directed    Diet - low sodium heart healthy   Complete by:  As directed    Increase activity slowly   Complete by:  As directed    Increase activity slowly   Complete by:  As directed      Allergies as of 09/17/2017   No Known Allergies     Medication List    STOP taking these medications   ciprofloxacin 500 MG tablet Commonly known as:  CIPRO   tranexamic acid 650 MG Tabs tablet Commonly known as:  [employer] Follow up on 09/24/2017.   Why:  at 12:15 pm Contact information: [address] [place] [address] [phone]       [facility] Follow up.   Why:  appt. March 10/01/2017 at 9 am stop earlier and pick up pakcet of information to have filled out ahead of time  Contact information: [place] [address] [phone]          No Known Allergies  Consultations:  GI Dr. Gala Romney   Procedures/Studies: Ct Abdomen Pelvis Wo Contrast  Result Date: 09/14/2017 CLINICAL DATA:  Rectal bleeding. EXAM: CT ABDOMEN AND PELVIS WITHOUT CONTRAST TECHNIQUE: Multidetector CT imaging of the abdomen and pelvis was performed following the standard protocol without IV contrast. COMPARISON:  None. FINDINGS: Lower chest: No acute abnormality. Hepatobiliary: No focal liver abnormality is seen.  No gallstones, gallbladder wall thickening, or biliary dilatation. Pancreas: Unremarkable. No pancreatic ductal dilatation or surrounding inflammatory changes. Spleen: Normal in size without focal abnormality. Adrenals/Urinary Tract: Adrenal glands are unremarkable. Kidneys are normal, without renal calculi, focal lesion, or hydronephrosis. Bladder is unremarkable. Stomach/Bowel: The stomach appears normal. The appendix is not clearly visualized. Stool is noted throughout the colon. There is dilatation of the rectum and distal sigmoid colon with wall thickening and  surrounding inflammation. There narrowing of the most distal aspect of the rectum, and neoplasm cannot be excluded. Vascular/Lymphatic: No significant vascular findings are present. No enlarged abdominal or pelvic lymph nodes. Reproductive: Prostate is unremarkable. Other: No abdominal wall hernia or abnormality. No abdominopelvic ascites. Musculoskeletal: No acute or significant osseous findings. IMPRESSION: Dilatation and wall thickening of the distal sigmoid colon and proximal rectum is noted with surrounding inflammation, concerning for proctocolitis. Narrowing of the most distal aspect of the rectum is noted, and possible neoplasm or obstruction cannot be excluded. Sigmoidoscopy is recommended for further evaluation. Electronically Signed   By: Marijo Conception, M.D.   On: 09/14/2017 15:19   US Abdomen Limited Ruq  Result Date: 09/16/2017 CLINICAL DATA:  Transaminitis EXAM: ULTRASOUND ABDOMEN LIMITED RIGHT UPPER QUADRANT COMPARISON:  09/14/2017 FINDINGS: Gallbladder: The gallbladder wall appears mildly thickened measuring 4 mm. No gallstones. Negative sonographic Murphy's sign. Common bile duct: Diameter: 4.8 mm Liver: No focal lesion identified. Within normal limits in parenchymal echogenicity. Portal vein is patent on color Doppler imaging with normal direction of blood flow towards the liver. IMPRESSION: 1. Mild gallbladder wall thickening. No additional signs of cholecystitis identified. 2. Unremarkable appearance of liver. Electronically Signed   By: Kerby Moors M.D.   On: 09/16/2017 10:44    Discharge Exam: Vitals:   09/17/17 0552 09/17/17 1429  BP: 116/77 109/78  Pulse: 76 77  Resp: 20 20  Temp: 97.9 F (36.6 C) 97.8 F (36.6 C)  SpO2: 99% 99%   Vitals:   09/16/17 2130 09/17/17 0552 09/17/17 0700 09/17/17 1429  BP: 123/88 116/77  109/78  Pulse: 75 76  77  Resp: (!) 22 20  20   Temp: 98.5 F (36.9 C) 97.9 F (36.6 C)  97.8 F (36.6 C)  TempSrc: Oral Oral  Oral  SpO2: 100% 99%   99%  Weight:   63.5 kg (139 lb 15.9 oz)   Height:        General: Pt is alert, awake, not in acute distress Cardiovascular: RRR, S1/S2 +, no rubs, no gallops Respiratory: CTA bilaterally, no wheezing, no rhonchi Abdominal: Soft, NT, ND, bowel sounds + Extremities: no edema, no cyanosis    The results of significant diagnostics from this hospitalization (including imaging, microbiology, ancillary and laboratory) are listed below for reference.     Microbiology: Recent Results (from the past 240 hour(s))  Gastrointestinal Panel by PCR , Stool     Status: Abnormal   Collection Time: 09/14/17  1:45 PM  Result Value Ref Range Status   Campylobacter species NOT DETECTED NOT DETECTED Final   Plesimonas shigelloides NOT DETECTED NOT DETECTED Final   Salmonella species NOT DETECTED NOT DETECTED Final   Yersinia enterocolitica NOT DETECTED NOT DETECTED Final   Vibrio species NOT DETECTED NOT DETECTED Final   Vibrio cholerae NOT DETECTED NOT DETECTED Final   Enteroaggregative E coli (EAEC) NOT DETECTED NOT DETECTED Final   Enteropathogenic E coli (EPEC) NOT DETECTED NOT DETECTED Final   Enterotoxigenic E coli (ETEC) NOT DETECTED NOT DETECTED Final   Shiga  like toxin producing E coli (STEC) NOT DETECTED NOT DETECTED Final   E. coli O157 NONE (A) NOT DETECTED Final   Shigella/Enteroinvasive E coli (EIEC) NOT DETECTED NOT DETECTED Final   Cryptosporidium NOT DETECTED NOT DETECTED Final   Cyclospora cayetanensis NOT DETECTED NOT DETECTED Final   Entamoeba histolytica NOT DETECTED NOT DETECTED Final   Giardia lamblia NOT DETECTED NOT DETECTED Final   Adenovirus F40/41 NOT DETECTED NOT DETECTED Final   Astrovirus NOT DETECTED NOT DETECTED Final   Norovirus GI/GII NOT DETECTED NOT DETECTED Final   Rotavirus A NOT DETECTED NOT DETECTED Final   Sapovirus (I, II, IV, and V) NOT DETECTED NOT DETECTED Final    Comment: Performed at [facility], [address] [place], [place] [address]   C difficile quick scan w PCR reflex     Status: None   Collection Time: 09/14/17  1:45 PM  Result Value Ref Range Status   C Diff antigen NEGATIVE NEGATIVE Final   C Diff toxin NEGATIVE NEGATIVE Final   C Diff interpretation No C. difficile detected.  Final    Comment: Performed at [facility], [address], [place], [place] [address]     Labs: BNP (last 3 results) No results for input(s): BNP in the last 8760 hours. Basic Metabolic Panel: Recent Labs  Lab 09/14/17 1246 09/15/17 0622 09/16/17 0350 09/17/17 0352  NA 139 136 138 139  K 4.2 3.6 3.7 3.6  CL 102 105 107 107  CO2 26 23 23 23   GLUCOSE 94 114* 82 75  BUN 11 9 5* <5*  CREATININE 0.94 0.79 0.78 0.79  CALCIUM 9.1 8.3* 8.5* 8.3*   Liver Function Tests: Recent Labs  Lab 09/14/17 1246 09/15/17 0622 09/16/17 0350  AST 79* 58* 42*  ALT 123* 93* 77*  ALKPHOS 52 39 38  BILITOT 0.6 0.7 0.5  PROT 7.5 5.6* 5.8*  ALBUMIN 3.7 2.8* 2.8*   No results for input(s): LIPASE, AMYLASE in the last 168 hours. No results for input(s): AMMONIA in the last 168 hours. CBC: Recent Labs  Lab 09/14/17 1246 09/15/17 0021 09/15/17 0622 09/16/17 0350 09/17/17 0352  WBC 6.2 6.1 4.7 4.3 4.7  HGB 13.9 11.0* 10.7* 10.7* 10.4*  HCT 43.2 34.3* 33.8* 34.0* 32.6*  MCV 87.4 87.1 87.1 87.2 86.7  PLT 425* 371 343 365 348   Cardiac Enzymes: Recent Labs  Lab 09/15/17 0021 09/15/17 0622 09/15/17 1204  TROPONINI <0.03 <0.03 <0.03   BNP: Invalid input(s): POCBNP CBG: No results for input(s): GLUCAP in the last 168 hours. D-Dimer No results for input(s): DDIMER in the last 72 hours. Hgb A1c No results for input(s): HGBA1C in the last 72 hours. Lipid Profile No results for input(s): CHOL, HDL, LDLCALC, TRIG, CHOLHDL, LDLDIRECT in the last 72 hours. Thyroid function studies Recent Labs    09/15/17 0021  TSH 1.234   Anemia work up No results for input(s): VITAMINB12, FOLATE, FERRITIN, TIBC, IRON, RETICCTPCT in the last 72  hours. Urinalysis No results found for: COLORURINE, APPEARANCEUR, [place], [place], [place], [place], [place], [place], PROTEINUR, UROBILINOGEN, NITRITE, LEUKOCYTESUR Sepsis Labs Invalid input(s): PROCALCITONIN,  WBC,  LACTICIDVEN Microbiology Recent Results (from the past 240 hour(s))  Gastrointestinal Panel by PCR , Stool     Status: Abnormal   Collection Time: 09/14/17  1:45 PM  Result Value Ref Range Status   Campylobacter species NOT DETECTED NOT DETECTED Final   Plesimonas shigelloides NOT DETECTED NOT DETECTED Final   Salmonella species NOT DETECTED NOT DETECTED Final  Yersinia enterocolitica NOT DETECTED NOT DETECTED Final   Vibrio species NOT DETECTED NOT DETECTED Final   Vibrio cholerae NOT DETECTED NOT DETECTED Final   Enteroaggregative E coli (EAEC) NOT DETECTED NOT DETECTED Final   Enteropathogenic E coli (EPEC) NOT DETECTED NOT DETECTED Final   Enterotoxigenic E coli (ETEC) NOT DETECTED NOT DETECTED Final   Shiga like toxin producing E coli (STEC) NOT DETECTED NOT DETECTED Final   E. coli O157 NONE (A) NOT DETECTED Final   Shigella/Enteroinvasive E coli (EIEC) NOT DETECTED NOT DETECTED Final   Cryptosporidium NOT DETECTED NOT DETECTED Final   Cyclospora cayetanensis NOT DETECTED NOT DETECTED Final   Entamoeba histolytica NOT DETECTED NOT DETECTED Final   Giardia lamblia NOT DETECTED NOT DETECTED Final   Adenovirus F40/41 NOT DETECTED NOT DETECTED Final   Astrovirus NOT DETECTED NOT DETECTED Final   Norovirus GI/GII NOT DETECTED NOT DETECTED Final   Rotavirus A NOT DETECTED NOT DETECTED Final   Sapovirus (I, II, IV, and V) NOT DETECTED NOT DETECTED Final    Comment: Performed at [facility], [address] [place], [place] [address]  C difficile quick scan w PCR reflex     Status: None   Collection Time: 09/14/17  1:45 PM  Result Value Ref Range Status   C Diff antigen NEGATIVE NEGATIVE Final   C Diff toxin NEGATIVE NEGATIVE Final   C Diff  interpretation No C. difficile detected.  Final    Comment: Performed at [facility], [address] [place], [place] [address]     Time coordinating discharge: Over 30 minutes  SIGNED:   Rodena Goldmann, DO [facility] 09/17/2017, 4:14 PM Pager [phone]  If 7PM-7AM, please contact night-coverage [email or web address] Password [email or web address]

## 2017-09-17 NOTE — Care Management Note (Addendum)
Case Management Note  Patient Details  Name: Agustine Rossitto MRN: [id number] Date of Birth: 1966/07/07   Expected Discharge Date:  09/17/17               Expected Discharge Plan:  Home/Self Care  In-House Referral:     Discharge planning Services  CM Consult, Follow-up appt scheduled, [facility]  Post Acute Care Choice:    Choice offered to:     DME Arranged:    DME Agency:     HH Arranged:    [place] Agency:     Status of Service:  Completed, signed off  If discussed at [employer] of Stay Meetings, dates discussed:    Additional Comments: Patient discharging home today. Does not have PCP or insurance. CM made appt at [facility] for March 28th at 0900. Patient given information with explanation and added to AVS. Jayliani Wanner, Chauncey Reading, RN 09/17/2017, 3:44 PM

## 2017-09-17 NOTE — Progress Notes (Signed)
Subjective:  Patient aware of colonoscopy findings now. Continues to have bloody output from rectum. No abdominal pain.   Objective: Vital signs in last 24 hours: Temp:  [97.7 F (36.5 C)-98.5 F (36.9 C)] 97.9 F (36.6 C) (02/14 0552) Pulse Rate:  [65-78] 76 (02/14 0552) Resp:  [20-24] 20 (02/14 0552) BP: (116-152)/(77-105) 116/77 (02/14 0552) SpO2:  [99 %-100 %] 99 % (02/14 0552) Weight:  [139 lb 15.9 oz (63.5 kg)] 139 lb 15.9 oz (63.5 kg) (02/14 0700) Last BM Date: 09/16/17 General:   Alert,  Well-developed, well-nourished, pleasant and cooperative in NAD Head:  Normocephalic and atraumatic. Eyes:  Sclera clear, no icterus.   Abdomen:  Soft, nontender and nondistended.     Extremities:  Without clubbing, deformity or edema. Neurologic:  Alert and  oriented x4;  grossly normal neurologically. Skin:  Intact without significant lesions or rashes. Psych:  Alert and cooperative. Normal mood and affect.  Intake/Output from previous day: 02/13 0701 - 02/14 0700 In: 1000 [I.V.:1000] Out: 200 [Urine:200] Intake/Output this shift: No intake/output data recorded.  Lab Results: CBC Recent Labs    09/15/17 0622 09/16/17 0350 09/17/17 0352  WBC 4.7 4.3 4.7  HGB 10.7* 10.7* 10.4*  HCT 33.8* 34.0* 32.6*  MCV 87.1 87.2 86.7  PLT 343 365 348   BMET Recent Labs    09/15/17 0622 09/16/17 0350 09/17/17 0352  NA 136 138 139  K 3.6 3.7 3.6  CL 105 107 107  CO2 23 23 23   GLUCOSE 114* 82 75  BUN 9 5* <5*  CREATININE 0.79 0.78 0.79  CALCIUM 8.3* 8.5* 8.3*   LFTs Recent Labs    09/14/17 1246 09/15/17 0622 09/16/17 0350  BILITOT 0.6 0.7 0.5  ALKPHOS 52 39 38  AST 79* 58* 42*  ALT 123* 93* 77*  PROT 7.5 5.6* 5.8*  ALBUMIN 3.7 2.8* 2.8*   No results for input(s): LIPASE in the last 72 hours. PT/INR No results for input(s): LABPROT, INR in the last 72 hours.    Imaging Studies: Ct Abdomen Pelvis Wo Contrast  Result Date: 09/14/2017 CLINICAL DATA:  Rectal bleeding.  EXAM: CT ABDOMEN AND PELVIS WITHOUT CONTRAST TECHNIQUE: Multidetector CT imaging of the abdomen and pelvis was performed following the standard protocol without IV contrast. COMPARISON:  None. FINDINGS: Lower chest: No acute abnormality. Hepatobiliary: No focal liver abnormality is seen. No gallstones, gallbladder wall thickening, or biliary dilatation. Pancreas: Unremarkable. No pancreatic ductal dilatation or surrounding inflammatory changes. Spleen: Normal in size without focal abnormality. Adrenals/Urinary Tract: Adrenal glands are unremarkable. Kidneys are normal, without renal calculi, focal lesion, or hydronephrosis. Bladder is unremarkable. Stomach/Bowel: The stomach appears normal. The appendix is not clearly visualized. Stool is noted throughout the colon. There is dilatation of the rectum and distal sigmoid colon with wall thickening and surrounding inflammation. There narrowing of the most distal aspect of the rectum, and neoplasm cannot be excluded. Vascular/Lymphatic: No significant vascular findings are present. No enlarged abdominal or pelvic lymph nodes. Reproductive: Prostate is unremarkable. Other: No abdominal wall hernia or abnormality. No abdominopelvic ascites. Musculoskeletal: No acute or significant osseous findings. IMPRESSION: Dilatation and wall thickening of the distal sigmoid colon and proximal rectum is noted with surrounding inflammation, concerning for proctocolitis. Narrowing of the most distal aspect of the rectum is noted, and possible neoplasm or obstruction cannot be excluded. Sigmoidoscopy is recommended for further evaluation. Electronically Signed   By: Marijo Conception, M.D.   On: 09/14/2017 15:19   US Abdomen Limited Ruq  Result Date: 09/16/2017 CLINICAL DATA:  Transaminitis EXAM: ULTRASOUND ABDOMEN LIMITED RIGHT UPPER QUADRANT COMPARISON:  09/14/2017 FINDINGS: Gallbladder: The gallbladder wall appears mildly thickened measuring 4 mm. No gallstones. Negative sonographic  Murphy's sign. Common bile duct: Diameter: 4.8 mm Liver: No focal lesion identified. Within normal limits in parenchymal echogenicity. Portal vein is patent on color Doppler imaging with normal direction of blood flow towards the liver. IMPRESSION: 1. Mild gallbladder wall thickening. No additional signs of cholecystitis identified. 2. Unremarkable appearance of liver. Electronically Signed   By: Kerby Moors M.D.   On: 09/16/2017 10:44  [2 weeks]   Assessment:  52 year old male with history of chronic intermittent rectal bleeding, now with increased rectal bleeding and associated diarrhea and tenesmus for the past 2 weeks. CT scan with concern for proctocolitis, narrowing of distal aspect of rectum noted.   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 Biopsies pending.   Elevated transaminases: Transaminases normal at [facility] 08/31/17. Improved today from admission. Endorses alcohol use. Hepatitis panel negative. Liver unremarkable on CT and u/s showed mild gb wall thickening but otherwise unremarkable. No evidence of distant mets.     Plan: 1. Oncology consultation. Discussed with Sharl Ma who will notify oncology.  2. Clear liquid diet.  3. Patient may need surgical intervention sooner than later due to near obstruction.  4. F/u pending path.   Laureen Ochs. Bernarda Caffey [facility] [phone] 2/14/20199:10 AM  Attending note: Patient seen and examined it. Discussed colonoscopy findings with him. Biopsies may take another business day or 2 to come back  Discussed with Dr. Manuella Ghazi last evening. Discussed with Dr. Walden Field today.  Patient is hungry and wants to eat. Will advance to a soft/low residue diet. If tolerated, he could probably go home later today with her early interval follow-up with oncology next week.  I believe we can hold off on surgery consultation for the time being.       LOS: 3  days

## 2017-09-17 NOTE — Telephone Encounter (Signed)
Spoke with pts brother. Pts brother stated that RMR saw pt in the hospital and he wonder if he had results. Pts brother is aware that results will be given when results are available. Pts brother is ok with that.

## 2017-09-17 NOTE — Anesthesia Postprocedure Evaluation (Signed)
Anesthesia Post Note  Patient: Tanner Dudley  Procedure(s) Performed: COLONOSCOPY WITH PROPOFOL (N/A ) BIOPSY  Patient location during evaluation: PACU Anesthesia Type: MAC Level of consciousness: awake and alert and patient cooperative Pain management: satisfactory to patient Vital Signs Assessment: vitals unstable Respiratory status: spontaneous breathing Cardiovascular status: stable Postop Assessment: no apparent nausea or vomiting Anesthetic complications: no     Last Vitals:  Vitals:   09/16/17 2130 09/17/17 0552  BP: 123/88 116/77  Pulse: 75 76  Resp: (!) 22 20  Temp: 36.9 C 36.6 C  SpO2: 100% 99%    Last Pain:  Vitals:   09/17/17 0800  TempSrc:   PainSc: 0-No pain                 [name]

## 2017-09-17 NOTE — Progress Notes (Signed)
Pt's IV catheter removed and intact. Pt's IV site clean dry and intact. Discharge instruction reviewed and discussed with patient. All questions were answered and no further questions at this time. Pt in stable condition and in no acute distress at time of discharge. Pt will be escorted by nurse tech.

## 2017-09-18 ENCOUNTER — Encounter (HOSPITAL_COMMUNITY): Payer: Self-pay | Admitting: Internal Medicine

## 2017-09-24 ENCOUNTER — Inpatient Hospital Stay (HOSPITAL_COMMUNITY): Payer: Medicaid Other | Attending: Internal Medicine | Admitting: Internal Medicine

## 2017-09-24 ENCOUNTER — Encounter (HOSPITAL_COMMUNITY): Payer: Self-pay | Admitting: Internal Medicine

## 2017-09-24 ENCOUNTER — Inpatient Hospital Stay (HOSPITAL_COMMUNITY): Payer: Medicaid Other

## 2017-09-24 DIAGNOSIS — D490 Neoplasm of unspecified behavior of digestive system: Secondary | ICD-10-CM

## 2017-09-24 DIAGNOSIS — Z79899 Other long term (current) drug therapy: Secondary | ICD-10-CM | POA: Insufficient documentation

## 2017-09-24 DIAGNOSIS — R634 Abnormal weight loss: Secondary | ICD-10-CM | POA: Insufficient documentation

## 2017-09-24 DIAGNOSIS — C20 Malignant neoplasm of rectum: Secondary | ICD-10-CM | POA: Insufficient documentation

## 2017-09-24 DIAGNOSIS — R197 Diarrhea, unspecified: Secondary | ICD-10-CM | POA: Diagnosis not present

## 2017-09-24 DIAGNOSIS — F17210 Nicotine dependence, cigarettes, uncomplicated: Secondary | ICD-10-CM | POA: Insufficient documentation

## 2017-09-24 LAB — FERRITIN: FERRITIN: 16 ng/mL — AB (ref 24–336)

## 2017-09-24 LAB — CBC WITH DIFFERENTIAL/PLATELET
BASOS ABS: 0.2 10*3/uL — AB (ref 0.0–0.1)
Basophils Relative: 3 %
Eosinophils Absolute: 0.1 10*3/uL (ref 0.0–0.7)
Eosinophils Relative: 1 %
HEMATOCRIT: 41.5 % (ref 39.0–52.0)
HEMOGLOBIN: 13.1 g/dL (ref 13.0–17.0)
LYMPHS ABS: 1.3 10*3/uL (ref 0.7–4.0)
Lymphocytes Relative: 25 %
MCH: 27.1 pg (ref 26.0–34.0)
MCHC: 31.6 g/dL (ref 30.0–36.0)
MCV: 85.7 fL (ref 78.0–100.0)
Monocytes Absolute: 0.7 10*3/uL (ref 0.1–1.0)
Monocytes Relative: 12 %
NEUTROS ABS: 3.2 10*3/uL (ref 1.7–7.7)
NEUTROS PCT: 59 %
PLATELETS: 457 10*3/uL — AB (ref 150–400)
RBC: 4.84 MIL/uL (ref 4.22–5.81)
RDW: 14.6 % (ref 11.5–15.5)
WBC: 5.4 10*3/uL (ref 4.0–10.5)

## 2017-09-24 LAB — COMPREHENSIVE METABOLIC PANEL
ALK PHOS: 50 U/L (ref 38–126)
ALT: 37 U/L (ref 17–63)
AST: 31 U/L (ref 15–41)
Albumin: 3.8 g/dL (ref 3.5–5.0)
Anion gap: 11 (ref 5–15)
BUN: 9 mg/dL (ref 6–20)
CALCIUM: 9.2 mg/dL (ref 8.9–10.3)
CO2: 27 mmol/L (ref 22–32)
CREATININE: 0.83 mg/dL (ref 0.61–1.24)
Chloride: 98 mmol/L — ABNORMAL LOW (ref 101–111)
Glucose, Bld: 87 mg/dL (ref 65–99)
Potassium: 4.1 mmol/L (ref 3.5–5.1)
Sodium: 136 mmol/L (ref 135–145)
Total Bilirubin: 0.3 mg/dL (ref 0.3–1.2)
Total Protein: 8.1 g/dL (ref 6.5–8.1)

## 2017-09-24 LAB — LACTATE DEHYDROGENASE: LDH: 120 U/L (ref 98–192)

## 2017-09-24 NOTE — Patient Instructions (Addendum)
[place] at [facility] Discharge Instructions  RECOMMENDATIONS MADE BY THE CONSULTANT AND ANY TEST RESULTS WILL BE SENT TO YOUR REFERRING PHYSICIAN.  You saw Dr. Mathis Dad Higgs today We will get you scheduled for PET scan as soon as possible We will get your referred to radiation and a surgeon as soon as possible You will be taking a chemotherapy pill ( Xeloda ) only on the days that you take radiation therapy. After completion of radiation you will be evaluated for possible surgery.   Lab work will be drawn today. We will call you with the results. We will see you back in our clinic in 2 weeks.  Thank you for choosing [place] at [facility] to provide your oncology and hematology care.  To afford each patient quality time with our provider, please arrive at least 15 minutes before your scheduled appointment time.    If you have a lab appointment with the [place] please come in thru the  Main Entrance and check in at the main information desk  You need to re-schedule your appointment should you arrive 10 or more minutes late.  We strive to give you quality time with our providers, and arriving late affects you and other patients whose appointments are after yours.  Also, if you no show three or more times for appointments you may be dismissed from the clinic at the providers discretion.     Again, thank you for choosing [facility].  Our hope is that these requests will decrease the amount of time that you wait before being seen by our physicians.       _____________________________________________________________  Should you have questions after your visit to [facility], please contact our office at [phone] between the hours of 8:30 a.m. and 4:30 p.m.  Voicemails left after 4:30 p.m. will not be returned until the following business day.  For prescription refill requests, have your pharmacy contact our  office.       Resources For Cancer Patients and their Caregivers ? American Cancer Society: Can assist with transportation, wigs, general needs, runs Look Good Feel Better.        570 288 4971 ? Cancer Care: Provides financial assistance, online support groups, medication/co-pay assistance.  1-800-813-HOPE 414-722-0977) ? [place] Assists [place] cancer patients and their families through emotional , educational and financial support.  737-771-0984 ? [place] DSS Where to apply for food stamps, Medicaid and utility assistance. (408) 001-0043 ? [facility]: Transportation to medical appointments. 216-189-9362 ? Social Security Administration: May apply for disability if have a Stage IV cancer. 860 610 2537 857-060-8201 ? [employer], Disability and Transit Services: Assists with nutrition, care and transit needs. [place] Support Programs: @10RELATIVEDAYS @ > Cancer Support Group  2nd Tuesday of the month 1pm-2pm, Journey Room  > Creative Journey  3rd Tuesday of the month 1130am-1pm, Journey Room  > Look Good Feel Better  1st Wednesday of the month 10am-12 noon, Journey Room (Call [place] to register [phone])

## 2017-09-25 ENCOUNTER — Encounter: Payer: Self-pay | Admitting: Radiation Oncology

## 2017-09-25 LAB — HIV ANTIBODY (ROUTINE TESTING W REFLEX): HIV Screen 4th Generation wRfx: NONREACTIVE

## 2017-09-25 LAB — CEA: CEA: 73.4 ng/mL — ABNORMAL HIGH (ref 0.0–4.7)

## 2017-09-25 NOTE — Progress Notes (Signed)
Referring Physician:  Dr. Gala Romney  Diagnosis Neoplasm of digestive system - Plan: CBC with Differential/Platelet, Comprehensive metabolic panel, Lactate dehydrogenase, Ferritin, HIV antibody (with reflex), NM PET Image Initial (PI) Skull Base To Thigh, CEA  Staging Cancer Staging No matching staging information was found for the patient.  Assessment and Plan 1.  Rectal adenocarcinoma.   52 year old male with a history of GI symptoms.  He reported blood in the stool.  He also reported weight loss.  He has a family history of melanoma.  He subsequently was seen by Dr. Gala Romney and underwent colonoscopy which showed a rectal mass that was suspicious for malignancy.  He underwent colonoscopy that was unable to proceed due to the rectal mass.  Pathology returned as adenocarcinoma.  He continues to have some GI symptoms.  He is here for further evaluation secondary to the recent diagnosis of rectal adenocarcinoma.  Pt will be referred for surgical evaluation.  I have discussed with him that usually these cancers are treated with chemotherapy and radiation.  He was presented information concerning Xeloda as an option.  He will undergo a PET scan to complete his staging evaluation and will be referred for radiation evaluation.  He will return to clinic in 2 weeks to go over the results of his imaging.  Baseline labs obtained today as well as HIV studies.  CEA noted to be elevated at 73.    2.  Rectal bleeding.  This is likely due to the recent diagnosis of rectal adenocarcinoma.  Hemoglobin is 13 on labs performed today.  Ferritin 16.  We will continue to monitor labs as therapy proceeds.  3.  Family history of melanoma.  This occurred in his brother.  4.  Weight loss.  This is likely due to rectal adenocarcinoma.  Will continue to monitor weight as therapy proceeds.    5.  Diarrhea.  Electrolytes WNL.  Will monitor this as therapy proceeds.     HPI:  52 year old male with a history of GI symptoms.  He  reported blood in the stool.  He also reported weight loss.  He has a family history of melanoma.  He subsequently was seen by Dr. Gala Romney and underwent colonoscopy which showed a rectal mass that was suspicious for malignancy.  He underwent colonoscopy that was unable to proceed due to the rectal mass.  Pathology returned as an adenocarcinoma.  He continues to have some GI symptoms.  He is here for further evaluation secondary to the recent diagnosis of rectal adenocarcinoma.  Problem List Patient Active Problem List   Diagnosis Date Noted  . Rectal mass [K62.9]   . Proctocolitis with rectal bleeding [K52.9, K62.5]   . Rectal bleeding [K62.5] 09/14/2017  . Diarrhea [R19.7] 09/14/2017    Past Medical History Past Medical History:  Diagnosis Date  . Medical history non-contributory     Past Surgical History Past Surgical History:  Procedure Laterality Date  . BIOPSY  09/16/2017   Procedure: BIOPSY;  Surgeon: Daneil Dolin, MD;  [facility];  Service: Gastroenterology;;  anal rectal  . COLONOSCOPY WITH PROPOFOL N/A 09/16/2017   Procedure: COLONOSCOPY WITH PROPOFOL;  Surgeon: Daneil Dolin, MD;  Location: [facility];  Service: Gastroenterology;  Laterality: N/A;  . None      Family History Family History  Problem Relation Age of Onset  . Colon cancer Father        diagnosed in his 78s, succumbed to disease  . Diabetes Mother   . Heart  attack Mother   . Hypertension Mother   . Pancreatic cancer Sister   . Prostate cancer Brother   . Skin cancer Brother   . Inflammatory bowel disease Neg Hx   . Colon polyps Neg Hx      Social History  reports that he has been smoking cigarettes.  He started smoking about 20 years ago. He has been smoking about 1.00 pack per day. he has never used smokeless tobacco. He reports that he drinks alcohol. He reports that he does not use drugs.  Medications No current outpatient medications on file.  Allergies Patient has no known  allergies.  Review of Systems Review of Systems - Oncology ROS as per HPI otherwise 12 point ROS is negative.   Physical Exam  Vitals Wt Readings from Last 3 Encounters:  09/24/17 139 lb 4.8 oz (63.2 kg)  09/17/17 139 lb 15.9 oz (63.5 kg)   Temp Readings from Last 3 Encounters:  09/24/17 97.9 F (36.6 C) (Oral)  09/17/17 97.8 F (36.6 C) (Oral)   BP Readings from Last 3 Encounters:  09/24/17 (!) 128/96  09/17/17 109/78   Pulse Readings from Last 3 Encounters:  09/24/17 96  09/17/17 77   Constitutional: Well-developed, well-nourished, and in no distress.   HENT: Head: Normocephalic and atraumatic.  Mouth/Throat: No oropharyngeal exudate. Mucosa moist. Eyes: Pupils are equal, round, and reactive to light. Conjunctivae are normal. No scleral icterus.  Neck: Normal range of motion. Neck supple. No JVD present.  Cardiovascular: Normal rate, regular rhythm and normal heart sounds.  Exam reveals no gallop and no friction rub.   No murmur heard. Pulmonary/Chest: Effort normal and breath sounds normal. No respiratory distress. No wheezes.No rales.  Abdominal: Soft. Bowel sounds are normal. No distension.  Tender to palpation but no guarding noted.  Musculoskeletal: No edema or tenderness.  Lymphadenopathy: No cervical,axillary or supraclavicular adenopathy.  Neurological: Alert and oriented to person, place, and time. No cranial nerve deficit.  Skin: Skin is warm and dry. No rash noted. No erythema. No pallor.  Psychiatric: Affect and judgment normal.   Labs Appointment on 09/24/2017  Component Date Value Ref Range Status  . WBC 09/24/2017 5.4  4.0 - 10.5 K/uL Final  . RBC 09/24/2017 4.84  4.22 - 5.81 MIL/uL Final  . Hemoglobin 09/24/2017 13.1  13.0 - 17.0 g/dL Final  . HCT 09/24/2017 41.5  39.0 - 52.0 % Final  . MCV 09/24/2017 85.7  78.0 - 100.0 fL Final  . MCH 09/24/2017 27.1  26.0 - 34.0 pg Final  . MCHC 09/24/2017 31.6  30.0 - 36.0 g/dL Final  . RDW 09/24/2017 14.6   11.5 - 15.5 % Final  . Platelets 09/24/2017 457* 150 - 400 K/uL Final  . Neutrophils Relative % 09/24/2017 59  % Final  . Neutro Abs 09/24/2017 3.2  1.7 - 7.7 K/uL Final  . Lymphocytes Relative 09/24/2017 25  % Final  . Lymphs Abs 09/24/2017 1.3  0.7 - 4.0 K/uL Final  . Monocytes Relative 09/24/2017 12  % Final  . Monocytes Absolute 09/24/2017 0.7  0.1 - 1.0 K/uL Final  . Eosinophils Relative 09/24/2017 1  % Final  . Eosinophils Absolute 09/24/2017 0.1  0.0 - 0.7 K/uL Final  . Basophils Relative 09/24/2017 3  % Final  . Basophils Absolute 09/24/2017 0.2* 0.0 - 0.1 K/uL Final   Performed at [facility], [address] [place], [place] [address]  . Sodium 09/24/2017 136  135 - 145 mmol/L Final  . Potassium  09/24/2017 4.1  3.5 - 5.1 mmol/L Final  . Chloride 09/24/2017 98* 101 - 111 mmol/L Final  . CO2 09/24/2017 27  22 - 32 mmol/L Final  . Glucose, Bld 09/24/2017 87  65 - 99 mg/dL Final  . BUN 09/24/2017 9  6 - 20 mg/dL Final  . Creatinine, Ser 09/24/2017 0.83  0.61 - 1.24 mg/dL Final  . Calcium 09/24/2017 9.2  8.9 - 10.3 mg/dL Final  . Total Protein 09/24/2017 8.1  6.5 - 8.1 g/dL Final  . Albumin 09/24/2017 3.8  3.5 - 5.0 g/dL Final  . AST 09/24/2017 31  15 - 41 U/L Final  . ALT 09/24/2017 37  17 - 63 U/L Final  . Alkaline Phosphatase 09/24/2017 50  38 - 126 U/L Final  . Total Bilirubin 09/24/2017 0.3  0.3 - 1.2 mg/dL Final  . GFR calc non Af Amer 09/24/2017 >60  >60 mL/min Final  . GFR calc Af Amer 09/24/2017 >60  >60 mL/min Final   Comment: (NOTE) The eGFR has been calculated using the CKD EPI equation. This calculation has not been validated in all clinical situations. eGFR's persistently <60 mL/min signify possible Chronic Kidney Disease.   Georgiann Hahn gap 09/24/2017 11  5 - 15 Final   Performed at [facility], [address], [place], [place] [address]  . LDH 09/24/2017 120  98 - 192 U/L Final   Performed at [facility], [address], [place], [place] [address]  . Ferritin  09/24/2017 16* 24 - 336 ng/mL Final   Performed at [facility], [place] [address], [place], [place] [address]  . CEA 09/24/2017 73.4* 0.0 - 4.7 ng/mL Final   Comment: (NOTE)                             Nonsmokers          <3.9                             Smokers             <5.6 Roche [employer] Electrochemiluminescence Immunoassay (ECLIA) Values obtained with different assay methods or kits cannot be used interchangeably.  Results cannot be interpreted as absolute evidence of the presence or absence of malignant disease. Performed At: [facility] [place], [place] [id number] Rush Farmer MD OZ:[id number] Performed at [facility], [address], [place], [place] [address]   Office Visit on 09/24/2017  Component Date Value Ref Range Status  . HIV Screen 4th Generation wRfx 09/24/2017 Non Reactive  Non Reactive Final   Comment: (NOTE) Performed At: [facility] [place], [place] [id number] Rush Farmer MD UU:[id number] Performed at [facility], [address], [place], [place] [address]      Pathology Orders Placed This Encounter  Procedures  . NM PET Image Initial (PI) Skull Base To Thigh    Standing Status:   Future    Standing Expiration Date:   09/24/2018    Order Specific Question:   If indicated for the ordered procedure, I authorize the administration of a radiopharmaceutical per Radiology protocol    Answer:   Yes    Order Specific Question:   Preferred imaging location?    Answer:   [facility]    Order Specific Question:   Radiology Contrast Protocol - do NOT remove file path    Answer:   \\  [email or web address]  . CBC with Differential/Platelet    Standing Status:   Future    Number of Occurrences:   1    Standing Expiration Date:   09/24/2018  . Comprehensive metabolic panel    Standing Status:   Future    Number of Occurrences:   1    Standing Expiration Date:   09/24/2018  .  Lactate dehydrogenase    Standing Status:   Future    Number of Occurrences:   1    Standing Expiration Date:   09/24/2018  . Ferritin    Standing Status:   Future    Number of Occurrences:   1    Standing Expiration Date:   09/24/2018  . HIV antibody (with reflex)  . CEA    Standing Status:   Future    Number of Occurrences:   1    Standing Expiration Date:   09/24/2018    Zoila Shutter MD   CC:  Dr. Gala Romney

## 2017-09-29 ENCOUNTER — Encounter (HOSPITAL_COMMUNITY)
Admission: RE | Admit: 2017-09-29 | Discharge: 2017-09-29 | Disposition: A | Discharge status: Home / Self Care | Payer: Medicaid Other | Source: Ambulatory Visit | Attending: Internal Medicine | Admitting: Internal Medicine

## 2017-09-29 DIAGNOSIS — D490 Neoplasm of unspecified behavior of digestive system: Secondary | ICD-10-CM | POA: Diagnosis not present

## 2017-09-29 LAB — GLUCOSE, CAPILLARY: Glucose-Capillary: 97 mg/dL (ref 65–99)

## 2017-09-29 MED ORDER — FLUDEOXYGLUCOSE F - 18 (FDG) INJECTION
7.8000 | Freq: Once | INTRAVENOUS | Status: AC | PRN
Start: 2017-09-29 — End: 2017-09-29
  Administered 2017-09-29: 7.8 via INTRAVENOUS

## 2017-09-29 NOTE — Progress Notes (Addendum)
GI Location of Tumor / Histology: Rectum   Tanner Dudley presented with symptoms of: Blood in his stool and weight loss.  Biopsies of Rectum 09/16/2017    Past/Anticipated interventions by surgeon, if any:  Colonoscopy with Propofol: 09/16/2017 Dr. Gala Romney Biopsy: 09/16/2017 Dr. Gala Romney  Past/Anticipated interventions by medical oncology, if any:   Weight changes, if any: Weight loss of about 3.5 pounds since last Thursday.  Bowel/Bladder complaints, if any: Patient states he does not pee as much as he used to, roughly 4 times a day.  States he is having diarrhea daily with multiple BM's a day, also has the urge to go but cannot once he tries.  Nausea / Vomiting, if any: No  Pain issues, if any:  Lower back pain, occasional pain in his rectal area 1/10.  Any blood per rectum: States he was having a lot of blood in his stools but has started to decrease.  BP 99/88 (BP Location: Left Arm, Patient Position: Sitting, Cuff Size: Normal)   Pulse 98   Temp 99.1 F (37.3 C) (Oral)   Resp 18   Ht 5\' 7"  (1.702 m)   Wt 135 lb 3.2 oz (61.3 kg)   SpO2 98%   BMI 21.18 kg/m    Wt Readings from Last 3 Encounters:  10/01/17 135 lb 3.2 oz (61.3 kg)  09/24/17 139 lb 4.8 oz (63.2 kg)  09/17/17 139 lb 15.9 oz (63.5 kg)   SAFETY ISSUES:  Prior radiation? No  Pacemaker/ICD? No  Possible current pregnancy? No  Is the patient on methotrexate? No  Current Complaints/Details:

## 2017-09-30 ENCOUNTER — Ambulatory Visit (HOSPITAL_COMMUNITY): Payer: Self-pay | Admitting: Internal Medicine

## 2017-10-01 ENCOUNTER — Ambulatory Visit
Admission: RE | Admit: 2017-10-01 | Discharge: 2017-10-01 | Disposition: A | Discharge status: Home / Self Care | Payer: Medicaid Other | Source: Ambulatory Visit | Attending: Radiation Oncology | Admitting: Radiation Oncology

## 2017-10-01 ENCOUNTER — Other Ambulatory Visit (HOSPITAL_COMMUNITY): Payer: Self-pay | Admitting: Internal Medicine

## 2017-10-01 ENCOUNTER — Other Ambulatory Visit: Payer: Self-pay

## 2017-10-01 ENCOUNTER — Telehealth: Payer: Self-pay | Admitting: *Deleted

## 2017-10-01 ENCOUNTER — Encounter: Payer: Self-pay | Admitting: General Practice

## 2017-10-01 ENCOUNTER — Encounter: Payer: Self-pay | Admitting: Radiation Oncology

## 2017-10-01 VITALS — BP 99/88 | HR 98 | Temp 99.1°F | Resp 18 | Ht 67.0 in | Wt 135.2 lb

## 2017-10-01 DIAGNOSIS — D649 Anemia, unspecified: Secondary | ICD-10-CM | POA: Diagnosis not present

## 2017-10-01 DIAGNOSIS — C20 Malignant neoplasm of rectum: Secondary | ICD-10-CM

## 2017-10-01 DIAGNOSIS — R197 Diarrhea, unspecified: Secondary | ICD-10-CM | POA: Insufficient documentation

## 2017-10-01 DIAGNOSIS — F17210 Nicotine dependence, cigarettes, uncomplicated: Secondary | ICD-10-CM | POA: Insufficient documentation

## 2017-10-01 DIAGNOSIS — Z8042 Family history of malignant neoplasm of prostate: Secondary | ICD-10-CM | POA: Insufficient documentation

## 2017-10-01 DIAGNOSIS — Z800 Family history of malignant neoplasm of digestive organs: Secondary | ICD-10-CM | POA: Insufficient documentation

## 2017-10-01 MED ORDER — CAPECITABINE 500 MG PO TABS: ORAL_TABLET | ORAL | 0 refills | Status: DC

## 2017-10-01 NOTE — Progress Notes (Signed)
See progress note under physician consult

## 2017-10-01 NOTE — Progress Notes (Signed)
Consult 10/01/2017 BP 99/88 (BP Location: Left Arm, Patient Position: Sitting, Cuff Size: Normal)   Pulse 98   Temp 99.1 F (37.3 C) (Oral)   Resp 18   Ht 5\' 7"  (1.702 m)   Wt 135 lb 3.2 oz (61.3 kg)   SpO2 98%   BMI 21.18 kg/m     Cori Razor, RN

## 2017-10-01 NOTE — Progress Notes (Signed)
[place] Psychosocial Distress Screening Clinical Social Work  Clinical Social Work was referred by distress screening protocol.  The patient scored a 10 on the Psychosocial Distress Thermometer which indicates severe distress. Clinical Social Worker Edwyna Shell to assess for distress and other psychosocial needs. CSW and patient discussed common feeling and emotions when being diagnosed with cancer, and the importance of support during treatment. CSW informed patient of the support team and support services at [facility]. CSW provided contact information and encouraged patient to call with any questions or concerns.  Patient accompanied by support person, sister Vaughan Basta.  Pt recently moved from [place] to [place] after separating from his wife.  Has lived w sister approx one month.  Reports significant worry about his medical condition, "for about a year Donnald Garre been worried about this", anxious to get clarity re diagnosis and treatment plan.  Biggest worry is "how all this is going to turn out in the end" referring to prognosis.  May have financial and transportation issues.  Sister is his primary transport, "I own my own business, so it may be hard to bring him to many appointments in [place]."  If radiation is required, transportation may be problematic.  CSW discussed role of [employer], programs offered.  Mentioned community resources including [employer].  Pt needs referral to Development worker, community for help w insurance status/Medicaid.  Fin Advocate alerted and asked to assist.  CSW and patient discussed healthy/unhealthy methods of coping w anxiety.  Pt reports he uses alcohol/beer to cope w anxiety "I drink 2 - 3 12 oz beers every other day.   If I had the money, I would drink more."  Denies use of other forms of alcohol and drugs.  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  CSW conveyed information to APP who entered room, made plan to contact patient by phone tomorrow  after he has information on recommended treatment plan.   ONCBCN DISTRESS SCREENING 10/01/2017  Screening Type Initial Screening  Distress experienced in past week (1-10) 10  Family Problem type (No Data)  Emotional problem type Adjusting to illness  Spiritual/Religous concerns type Relating to God  Physical Problem type Pain;Sleep/insomnia;Constipation/diarrhea;Tingling hands/feet;Skin dry/itchy  Physician notified of physical symptoms Yes  Referral to clinical psychology No  Referral to clinical social work Yes  Referral to dietition No  Referral to financial advocate No  Referral to support programs No  Referral to palliative care No    Clinical Social Worker follow up needed: Yes.    If yes, follow up plan:  Call tomorrow to discuss community resources/referrals.  Edwyna Shell, LCSW Clinical Social Worker Phone:  [phone]

## 2017-10-01 NOTE — Telephone Encounter (Signed)
CALLED PATIENT TO INFORM OF APPT. WITH DR. Quitman Dudley ON 10-02-17- @ 8:45 AM , SPOKE WITH PATIENT AND HE IS AWARE OF THIS APPT.

## 2017-10-01 NOTE — Progress Notes (Signed)
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 Diagnoses of Diarrhea, unspecified type and Malignant neoplasm of rectum [facility]) were also pertinent to this visit.   HISTORY OF PRESENT ILLNESS: Tanner Dudley is a 52 y.o. male seen at the request of Dr. 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 He does not have any prior medical diagnoses. The patient reports that he was seen and evaluated after noting blood in his stool, and was seen by Dr. Buford Dresser and underwent colonoscopy on 09/16/2017.  This revealed a bulky apple core anorectal tumor protruding through the anus producing near obstruction of the distal rectum, in the lower GI tract upstream of the tumor appeared to be entirely normal with the exception of some slight dilatation.  Multiple biopsy specimens were obtained and revealed an adenocarcinoma.  He has met with medical oncology and has a CEA of 73, HIV was non reactive, and is being referred for surgical evaluation to see Dr. Arnoldo Morale on 10/06/17.  The plan is to proceed with concurrent radiotherapy and chemo under the care of Dr. Walden Field and she is anticipating Xeloda.  He did undergo PET scan on 09/29/2016 revealing intense hypermetabolic activity from the anus to the splenic flexure of the descending colon no evidence of metastatic adenopathy in the abdomen or pelvis was identified and no apparent dense of liver metastases was present.  He comes today to discuss the role for concurrent radiotherapy.   PREVIOUS RADIATION THERAPY: No   PAST MEDICAL HISTORY:  Past Medical History:  Diagnosis Date  . Medical history non-contributory        PAST SURGICAL HISTORY: Past Surgical History:    Procedure Laterality Date  . BIOPSY  09/16/2017   Procedure: BIOPSY;  Surgeon: Daneil Dolin, MD;  [facility];  Service: Gastroenterology;;  anal rectal  . COLONOSCOPY WITH PROPOFOL N/A 09/16/2017   Procedure: COLONOSCOPY WITH PROPOFOL;  Surgeon: Daneil Dolin, MD;  Location: [facility];  Service: Gastroenterology;  Laterality: N/A;  . None       FAMILY HISTORY:  Family History  Problem Relation Age of Onset  . Colon cancer Father        diagnosed in his 16s, succumbed to disease  . Diabetes Mother   . Heart attack Mother   . Hypertension Mother   . Pancreatic cancer Sister   . Prostate cancer Brother   . Melanoma Brother   . Inflammatory bowel disease Neg Hx   . Colon polyps Neg Hx      SOCIAL HISTORY:  reports that he has been smoking cigarettes.  He started smoking about 20 years ago. He has been smoking about 1.00 pack per day. he has never used smokeless tobacco. He reports that he drinks alcohol. He reports that he does not use drugs.  The patient is separated and lives in [place].  His sister is listed as his emergency contact and accompanies him to today's appointmnet. He's been living with his sister, but had previously been living in [facility]. 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   ALLERGIES: Patient has no known allergies.   MEDICATIONS:  No current outpatient medications on file.   No current facility-administered medications for this encounter.      REVIEW OF SYSTEMS: On review of systems, the patient reports that he is doing okay but is very nervous about his diagnosis and treatment recommendations. He denies any chest pain, shortness of breath, cough, fevers, chills, night sweats, unintended weight changes. He denies any bowel or bladder disturbances, and denies abdominal pain, nausea or vomiting. He denies any new musculoskeletal or joint aches or pains. A complete review of systems is obtained and is  otherwise negative.     PHYSICAL EXAM:  Wt Readings from Last 3 Encounters:  10/01/17 135 lb 3.2 oz (61.3 kg)  09/24/17 139 lb 4.8 oz (63.2 kg)  09/17/17 139 lb 15.9 oz (63.5 kg)   Temp Readings from Last 3 Encounters:  10/01/17 99.1 F (37.3 C) (Oral)  09/24/17 97.9 F (36.6 C) (Oral)  09/17/17 97.8 F (36.6 C) (Oral)   BP Readings from Last 3 Encounters:  10/01/17 99/88  09/24/17 (!) 128/96  09/17/17 109/78   Pulse Readings from Last 3 Encounters:  10/01/17 98  09/24/17 96  09/17/17 77   Pain Assessment Pain Score: 1  Pain Loc: (Lower back, off and on)/10  In general this is a well appearing African American male in no acute distress. He is alert and oriented x4 and appropriate throughout the examination. HEENT reveals that the patient is normocephalic, atraumatic. EOMs are intact. PERRLA. Skin is intact without any evidence of gross lesions. Cardiovascular exam reveals a regular rate and rhythm, no clicks rubs or murmurs are auscultated. Chest is clear to auscultation bilaterally. Lymphatic assessment is performed and does not reveal any adenopathy in the cervical, supraclavicular, axillary, or inguinal chains. Abdomen has active bowel sounds in all quadrants and is intact. The abdomen is soft, non tender, non distended. Lower extremities are negative for pretibial pitting edema, deep calf tenderness, cyanosis or clubbing. Inspection of the anorectal area reveals a large fungating tumor at the anal verge with serosanguinous discharge. DRE is deferred.  ECOG = 1  0 - Asymptomatic (Fully active, able to carry on all predisease activities without restriction)  1 - Symptomatic but completely ambulatory (Restricted in physically strenuous activity but ambulatory and able to carry out work of a light or sedentary nature. For example, light housework, office work)  2 - Symptomatic, <50% in bed during the day (Ambulatory and capable of all self care but unable to carry out any work  activities. Up and about more than 50% of waking hours)  3 - Symptomatic, >50% in bed, but not bedbound (Capable of only limited self-care, confined to bed or chair 50% or more of waking hours)  4 - Bedbound (Completely disabled. Cannot carry on any self-care. Totally confined to bed or chair)  5 - Death   Eustace Pen MM, Creech RH, Tormey DC, et al. [phone]). "Toxicity and response criteria of the [facility]". [place] Oncol. 5 (6): 649-55    LABORATORY DATA:  Lab Results  Component Value Date   WBC 5.4 09/24/2017   HGB 13.1 09/24/2017   HCT 41.5 09/24/2017   MCV 85.7 09/24/2017   PLT 457 (H) 09/24/2017   Lab Results  Component Value Date   NA 136 09/24/2017   K 4.1 09/24/2017   CL 98 (L) 09/24/2017   CO2 27 09/24/2017   Lab Results  Component Value Date   ALT 37 09/24/2017   AST 31 09/24/2017  ALKPHOS 50 09/24/2017   BILITOT 0.3 09/24/2017      RADIOGRAPHY: Ct Abdomen Pelvis Wo Contrast  Result Date: 09/14/2017 CLINICAL DATA:  Rectal bleeding. EXAM: CT ABDOMEN AND PELVIS WITHOUT CONTRAST TECHNIQUE: Multidetector CT imaging of the abdomen and pelvis was performed following the standard protocol without IV contrast. COMPARISON:  None. FINDINGS: Lower chest: No acute abnormality. Hepatobiliary: No focal liver abnormality is seen. No gallstones, gallbladder wall thickening, or biliary dilatation. Pancreas: Unremarkable. No pancreatic ductal dilatation or surrounding inflammatory changes. Spleen: Normal in size without focal abnormality. Adrenals/Urinary Tract: Adrenal glands are unremarkable. Kidneys are normal, without renal calculi, focal lesion, or hydronephrosis. Bladder is unremarkable. Stomach/Bowel: The stomach appears normal. The appendix is not clearly visualized. Stool is noted throughout the colon. There is dilatation of the rectum and distal sigmoid colon with wall thickening and surrounding inflammation. There narrowing of the most distal aspect  of the rectum, and neoplasm cannot be excluded. Vascular/Lymphatic: No significant vascular findings are present. No enlarged abdominal or pelvic lymph nodes. Reproductive: Prostate is unremarkable. Other: No abdominal wall hernia or abnormality. No abdominopelvic ascites. Musculoskeletal: No acute or significant osseous findings. IMPRESSION: Dilatation and wall thickening of the distal sigmoid colon and proximal rectum is noted with surrounding inflammation, concerning for proctocolitis. Narrowing of the most distal aspect of the rectum is noted, and possible neoplasm or obstruction cannot be excluded. Sigmoidoscopy is recommended for further evaluation. Electronically Signed   By: Marijo Conception, M.D.   On: 09/14/2017 15:19   Nm Pet Image Initial (pi) Skull Base To Thigh  Result Date: 09/29/2017 CLINICAL DATA:  Initial treatment strategy for rectal carcinoma 7.8. EXAM: NUCLEAR MEDICINE PET SKULL BASE TO THIGH TECHNIQUE: 7.8 mCi F-18 FDG was injected intravenously. Full-ring PET imaging was performed from the skull base to thigh after the radiotracer. CT data was obtained and used for attenuation correction and anatomic localization. FASTING BLOOD GLUCOSE:  Value: 69 mg/dl Mediastinal blood pool activity: SUV max equal 2.3 COMPARISON:  CT 09/14/2017 FINDINGS: NECK: No hypermetabolic lymph nodes in the neck. Incidental CT findings: none CHEST: No suspicious pulmonary nodule. No hypermetabolic mediastinal lymph nodes. Incidental CT findings: none ABDOMEN/PELVIS: There is intense activity at the anus and rectum; however no more intense than the diffuse intense activity throughout the sigmoid colon and descending colon. Hypermetabolic colon activity extends to the splenic flexure No hypermetabolic or enlarged pelvic lymph nodes. No hypermetabolic mesenteric periaortic lymph. No Hypermetabolic activity liver. Incidental CT findings: Pancreas, kidneys and prostate normal SKELETON: There is diffuse increase in marrow  activity and spine and pelvis. No CT lesion. No focal lesion. IMPRESSION: 1. Intense metabolic activity from the anus to the splenic flexure of the descending colon. As this segment of colon was normal on recent colonoscopy findings are likely related to inflammation from colonoscopy. Cannot differentiate the anal carcinoma metabolic activity from the presumed inflammatory activity in the bowel. 2. No evidence of metastatic adenopathy in the abdomen pelvis. 3. No evidence of liver metastasis. 4. No evidence of thoracic metastasis. 5. Diffuse increase in marrow activity is favored represent marrow hyperplasia related to anemia. Electronically Signed   By: Suzy Bouchard M.D.   On: 09/29/2017 17:27   US Abdomen Limited Ruq  Result Date: 09/16/2017 CLINICAL DATA:  Transaminitis EXAM: ULTRASOUND ABDOMEN LIMITED RIGHT UPPER QUADRANT COMPARISON:  09/14/2017 FINDINGS: Gallbladder: The gallbladder wall appears mildly thickened measuring 4 mm. No gallstones. Negative sonographic Murphy's sign. Common bile duct: Diameter: 4.8 mm Liver: No focal lesion identified. Within  normal limits in parenchymal echogenicity. Portal vein is patent on color Doppler imaging with normal direction of blood flow towards the liver. IMPRESSION: 1. Mild gallbladder wall thickening. No additional signs of cholecystitis identified. 2. Unremarkable appearance of liver. Electronically Signed   By: Kerby Moors M.D.   On: 09/16/2017 10:44       IMPRESSION/PLAN: 1. At least T2N0 adenocarcinoma of the distal rectum and anus. Dr. Lisbeth Renshaw discusses the pathology findings and reviews the nature of his disease, and that it appears bulky. We will proceed with MRI of the pelvis for formal staging. He is interested in proceeding with treatment closer to home in [place]. We did detail the role of chemoRT and we discussed the risks, benefits, short, and long term effects of radiotherapy, and the patient is interested in proceeding but with the [facility] group. Dr. Lisbeth Renshaw discusses the delivery and logistics of radiotherapy and anticipates a course of 5 1/2-6 weeks. We will coordinate his MRI and communicate his wishes for treatment in [facility] with radiation oncology, and at [facility] for surgery and medical oncology care. We would be happy to see him if he changes his mind for where he seeks care. 2. Possible genetic predisposition to malignancy.  The patient's personal history and family history is suspicious for possible genetic predisposition, and could perhaps be Lynch syndrome.  We discussed the rationale of meeting with genetic counseling, and the patient is interested and will be referred. Our genetics team does go to [facility] and we will request they see him there.   The above documentation reflects my direct findings during this shared patient visit. Please see the separate note by Dr. Lisbeth Renshaw on this date for the remainder of the patient's plan of care.    Carola Rhine, PAC

## 2017-10-02 ENCOUNTER — Other Ambulatory Visit: Payer: Self-pay | Admitting: Urology

## 2017-10-02 ENCOUNTER — Telehealth (HOSPITAL_COMMUNITY): Payer: Self-pay | Admitting: Adult Health

## 2017-10-02 DIAGNOSIS — K6289 Other specified diseases of anus and rectum: Secondary | ICD-10-CM

## 2017-10-02 NOTE — Telephone Encounter (Signed)
Received call from Dr. Quitman Livings with [facility] radiation oncology.   Dr. Raliegh Ip tells me that he has seen Tanner Dudley and feels that the patient may need more emergent diverting surgical intervention. Reportedly, on physical exam, Dr. Raliegh Ip could not do a digital rectal exam d/t size of tumor.  Patient also with urinary complaints and was recently treated for E.coli UTI, so Dr. Raliegh Ip is concerned pt may have bladder involvement with his rectal cancer.  He is scheduling additional imaging and blood work for this patient.   I reviewed Dr.Higgs' recent evaluation of pt from her consultation on 09/24/17. He was referral to surgery (Dr. Arnoldo Morale is scheduled to see him on 3/5), as well as rad onc (Dr. Lisbeth Renshaw in [place] referred the patient to Dr. Raliegh Ip in [place] since pt lives in [place]).      Dr. Raliegh Ip provided an update that he is going to reach out to Dr. Arnoldo Morale personally to see if the patient can have surgery sooner rather than later.  I have asked that Dr. Arnoldo Morale or Dr. Raliegh Ip let us know if the patient is going to undergo emergent surgery, so we can adjust his follow-up visits here accordingly. Dr. Raliegh Ip agrees.   I will route this message to Dr. Arnoldo Morale to make him aware as well.     Mike Craze, NP [place] [phone]

## 2017-10-06 ENCOUNTER — Ambulatory Visit (INDEPENDENT_AMBULATORY_CARE_PROVIDER_SITE_OTHER): Payer: Self-pay | Admitting: General Surgery

## 2017-10-06 ENCOUNTER — Encounter: Payer: Self-pay | Admitting: General Surgery

## 2017-10-06 VITALS — BP 126/89 | HR 112 | Temp 99.3°F | Ht 68.0 in | Wt 134.0 lb

## 2017-10-06 DIAGNOSIS — C20 Malignant neoplasm of rectum: Secondary | ICD-10-CM

## 2017-10-06 NOTE — Patient Instructions (Signed)
Tanner Dudley  10/06/2017     @PREFPERIOPPHARMACY @   Your procedure is scheduled on  10/09/2017 .  Report to [facility] at  825   A.M.  Call this number if you have problems the morning of surgery:  [phone]   Remember:  Do not eat food or drink liquids after midnight.  Take these medicines the morning of surgery with A SIP OF WATER  xeloda.   Do not wear jewelry, make-up or nail polish.  Do not wear lotions, powders, or perfumes, or deodorant.  Do not shave 48 hours prior to surgery.  Men may shave face and neck.  Do not bring valuables to the hospital.  [facility] is not responsible for any belongings or valuables.  Contacts, dentures or bridgework may not be worn into surgery.  Leave your suitcase in the car.  After surgery it may be brought to your room.  For patients admitted to the hospital, discharge time will be determined by your treatment team.  Patients discharged the day of surgery will not be allowed to drive home.   Name and phone number of your driver:  family Special instructions:  Follow the diet and prep instructions given to you by Dr Arnoldo Morale office.  Please read over the following fact sheets that you were given. Pain Booklet, Coughing and Deep Breathing, Blood Transfusion Information, Surgical Site Infection Prevention, Anesthesia Post-op Instructions and Care and Recovery After Surgery       Colostomy, Adult A colostomy is a surgical procedure that involves attaching part of the colon to the front of the abdomen (abdominal wall). The colon is the last part of the digestive tract. It is where water is absorbed from digested food to form stool (feces). A colostomy is done to redirect stool through an opening (stoma) in the abdominal wall. You may need this surgery if you have a medical condition that prevents stool from leaving your body through the usual opening (rectum). A bag will be attached to the stoma on the outside of your body. This  bag will collect the stool and waste that is redirected through the stoma. A colostomy may be temporary or permanent. Tell a health care provider about:  Any allergies you have.  All medicines you are taking, including vitamins, herbs, eye drops, creams, and over-the-counter medicines.  Any problems you or family members have had with anesthetic medicines.  Any blood disorders you have.  Any surgeries you have had.  Any medical conditions you have.  Whether you are pregnant or may be pregnant. What are the risks? Generally, this is a safe procedure. However, problems may occur, including:  Infection.  Bleeding.  Allergic reactions to medicines.  Damage to other structures or organs.  Leaking of stool inside the abdomen.  Formation of scar tissue that causes a blockage.  What happens before the procedure?  Follow instructions from your health care provider about eating or drinking restrictions.  Ask your health care provider about: ? Changing or stopping your regular medicines. This is especially important if you are taking diabetes medicines or blood thinners. ? Taking medicines such as aspirin and ibuprofen. These medicines can thin your blood. Do not take these medicines before your procedure if your health care provider instructs you not to.  Do not use any tobacco products, such as cigarettes, chewing tobacco, and e-cigarettes. If you need help quitting, ask your health care provider.  Plan to have someone take you  home after the procedure.  You may have an exam or testing.  Ask your health care provider how your surgical site will be marked or identified.  You may be given antibiotic medicine to help prevent infection. What happens during the procedure?  To reduce your risk of infection: ? Your health care team will wash or sanitize their hands. ? Your skin will be washed with soap.  An IV tube will be inserted into one of your veins.  A drainage tube may  be passed through your nose and into your stomach (NG tube, or nasogastric tube).  You may be given a medicine to help you relax (sedative).  You will be given a medicine to make you fall asleep (general anesthetic).  An incision will be made in the front of your abdomen.  The muscles under the skin will be divided or separated.  The next steps will vary depending on the type of colostomy. There are two main types: ? End colostomy. Part of the colon will be removed, or the colon will be divided into two separate parts. The end of the colon that is attached to the upper part of the digestive tract will be attached to the wall of the abdomen, creating a stoma. The other end will be closed off. ? Loop colostomy. A part of the colon will be pulled through the incision in the abdomen. Two openings will be made in the side of the colon. The colon will be attached to the skin at these openings, creating the stoma.  Stitches (sutures) will be used to create the stoma and close the incision.  A colostomy bag will be placed over the stoma to collect stool and mucus. The procedure may vary among health care providers and hospitals. What happens after the procedure?  Your blood pressure, heart rate, breathing rate, and blood oxygen level will be monitored often until the medicines you were given have worn off.  You will be given pain medicine as needed.  You will receive fluids and nutrition through an IV tube.  As soon as you are eating well and passing stool through the colostomy, the nasogastric tube and the IV tube may be removed.  Do not drive for 24 hours if you received a sedative. This information is not intended to replace advice given to you by your health care provider. Make sure you discuss any questions you have with your health care provider. Document Released: 12/11/2010 Document Revised: 12/27/2015 Document Reviewed: 04/03/2015 Elsevier Interactive Patient Education  2018 [employer].  Colostomy, Adult, Care After Refer to this sheet in the next few weeks. These instructions provide you with information about caring for yourself after your procedure. Your health care provider may also give you more specific instructions. Your treatment has been planned according to current medical practices, but problems sometimes occur. Call your health care provider if you have any problems or questions after your procedure. What can I expect after the procedure? After the procedure, it is common to have:  Swelling at the opening that was created during the procedure (stoma).  Slight bleeding around the stoma.  Redness around the stoma.  Follow these instructions at home: Activity  Rest as needed while the stoma area heals.  Return to your normal activities as told by your health care provider. Ask your health care provider what activities are safe for you.  Avoid strenuous activity and abdominal exercises for 3 weeks or for as long as told by your health  care provider.  Do not lift anything that is heavier than 10 lb (4.5 kg). Incision care   Follow instructions from your health care provider about how to take care of your incision. Make sure you: ? Wash your hands with soap and water before you change your bandage (dressing). If soap and water are not available, use hand sanitizer. ? Change your dressing as told by your health care provider. ? Leave stitches (sutures), skin glue, or adhesive strips in place. These skin closures may need to stay in place for 2 weeks or longer. If adhesive strip edges start to loosen and curl up, you may trim the loose edges. Do not remove adhesive strips completely unless your health care provider tells you to do that. Stoma Care  Keep the stoma area clean.  Clean and dry the skin around the stoma each time you change the colostomy bag. To clean the stoma area: ? Use warm water and only use cleansers that are recommended by your health  care provider. ? Rinse the stoma area with plain water. ? Dry the area well.  Use stoma powder or ointment on your skin only as told by your health care provider. Do not use any other powders, gels, wipes, or creams on your skin.  Check the stoma area every day for signs of infection. Check for: ? More redness, swelling, or pain. ? More fluid or blood. ? Pus or warmth.  Measure the stoma opening regularly and record the size. Watch for changes. Share this information with your health care provider. Bathing  Do not take baths, swim, or use a hot tub until your health care provider approves. Ask your health care provider if you can take showers. You may be able to shower with or without the colostomy bag in place. If you bathe with the bag on, dry the bag afterward.  Avoid using harsh or oily soaps when you bathe. Colostomy Bag Care  Follow instructions from your health care provider about how to empty or change the colostomy bag.  Keep colostomy supplies with you at all times.  Store all supplies in a cool, dry place.  Empty the colostomy bag: ? Whenever it is one-third to one-half full. ? At bedtime.  Replace the bag every 2-4 days or as told by your health care provider. Driving  Do not drive for 24 hours if you received a sedative.  Do not drive or operate heavy machinery while taking prescription pain medicine. General instructions  Follow instructions from your health care provider about eating or drinking restrictions.  Take over-the-counter and prescription medicines only as told by your health care provider.  Avoid wearing clothes that are tight directly over your stoma.  Do not use any tobacco products, such as cigarettes, chewing tobacco, and e-cigarettes. If you need help quitting, ask your health care provider.  (Women) Ask your health care provider about becoming pregnant and about using birth control. Medicines may not be absorbed normally after the  procedure.  Keep all follow-up visits as told by your health care provider. This is important. Contact a health care provider if:  You are having trouble caring for your stoma or changing the colostomy bag.  You feel nauseous or you vomit.  You have a fever.  You havemore redness, swelling, or pain at the site of your stoma or around your anus.  You have more fluid or blood coming from your stoma or your anus.  Your stoma area feels warm to the  touch.  You have pus coming from your stoma.  You notice a change in the size or appearance of the stoma.  You have abdominal pain, bloating, pressure, or cramping.  Your have stool more often or less often than your health care provider tells you to expect.  You are not making much urine. This may be a sign of dehydration. Get help right away if:  Your abdominal pain does not go away or it becomes severe.  You keep vomiting.  Your stool is not draining through the stoma.  You have chest pain or an irregular heartbeat. This information is not intended to replace advice given to you by your health care provider. Make sure you discuss any questions you have with your health care provider. Document Released: 12/11/2010 Document Revised: 11/29/2015 Document Reviewed: 04/03/2015 Elsevier Interactive Patient Education  2018 [place] Anesthesia, Adult General anesthesia is the use of medicines to make a person "go to sleep" (be unconscious) for a medical procedure. General anesthesia is often recommended when a procedure:  Is long.  Requires you to be still or in an unusual position.  Is major and can cause you to lose blood.  Is impossible to do without general anesthesia.  The medicines used for general anesthesia are called general anesthetics. In addition to making you sleep, the medicines:  Prevent pain.  Control your blood pressure.  Relax your muscles.  Tell a health care provider about:  Any  allergies you have.  All medicines you are taking, including vitamins, herbs, eye drops, creams, and over-the-counter medicines.  Any problems you or family members have had with anesthetic medicines.  Types of anesthetics you have had in the past.  Any bleeding disorders you have.  Any surgeries you have had.  Any medical conditions you have.  Any history of heart or lung conditions, such as heart failure, sleep apnea, or chronic obstructive pulmonary disease (COPD).  Whether you are pregnant or may be pregnant.  Whether you use tobacco, alcohol, marijuana, or street drugs.  Any history of Armed forces logistics/support/administrative officer.  Any history of depression or anxiety. What are the risks? Generally, this is a safe procedure. However, problems may occur, including:  Allergic reaction to anesthetics.  Lung and heart problems.  Inhaling food or liquids from your stomach into your lungs (aspiration).  Injury to nerves.  Waking up during your procedure and being unable to move (rare).  Extreme agitation or a state of mental confusion (delirium) when you wake up from the anesthetic.  Air in the bloodstream, which can lead to stroke.  These problems are more likely to develop if you are having a major surgery or if you have an advanced medical condition. You can prevent some of these complications by answering all of your health care provider's questions thoroughly and by following all pre-procedure instructions. General anesthesia can cause side effects, including:  Nausea or vomiting  A sore throat from the breathing tube.  Feeling cold or shivery.  Feeling tired, washed out, or achy.  Sleepiness or drowsiness.  Confusion or agitation.  What happens before the procedure? Staying hydrated Follow instructions from your health care provider about hydration, which may include:  Up to 2 hours before the procedure - you may continue to drink clear liquids, such as water, clear fruit juice,  black coffee, and plain tea.  Eating and drinking restrictions Follow instructions from your health care provider about eating and drinking, which may include:  8 hours before the  procedure - stop eating heavy meals or foods such as meat, fried foods, or fatty foods.  6 hours before the procedure - stop eating light meals or foods, such as toast or cereal.  6 hours before the procedure - stop drinking milk or drinks that contain milk.  2 hours before the procedure - stop drinking clear liquids.  Medicines  Ask your health care provider about: ? Changing or stopping your regular medicines. This is especially important if you are taking diabetes medicines or blood thinners. ? Taking medicines such as aspirin and ibuprofen. These medicines can thin your blood. Do not take these medicines before your procedure if your health care provider instructs you not to. ? Taking new dietary supplements or medicines. Do not take these during the week before your procedure unless your health care provider approves them.  If you are told to take a medicine or to continue taking a medicine on the day of the procedure, take the medicine with sips of water. General instructions   Ask if you will be going home the same day, the following day, or after a longer hospital stay. ? Plan to have someone take you home. ? Plan to have someone stay with you for the first 24 hours after you leave the hospital or clinic.  For 3-6 weeks before the procedure, try not to use any tobacco products, such as cigarettes, chewing tobacco, and e-cigarettes.  You may brush your teeth on the morning of the procedure, but make sure to spit out the toothpaste. What happens during the procedure?  You will be given anesthetics through a mask and through an IV tube in one of your veins.  You may receive medicine to help you relax (sedative).  As soon as you are asleep, a breathing tube may be used to help you breathe.  An  anesthesia specialist will stay with you throughout the procedure. He or she will help keep you comfortable and safe by continuing to give you medicines and adjusting the amount of medicine that you get. He or she will also watch your blood pressure, pulse, and oxygen levels to make sure that the anesthetics do not cause any problems.  If a breathing tube was used to help you breathe, it will be removed before you wake up. The procedure may vary among health care providers and hospitals. What happens after the procedure?  You will wake up, often slowly, after the procedure is complete, usually in a recovery area.  Your blood pressure, heart rate, breathing rate, and blood oxygen level will be monitored until the medicines you were given have worn off.  You may be given medicine to help you calm down if you feel anxious or agitated.  If you will be going home the same day, your health care provider may check to make sure you can stand, drink, and urinate.  Your health care providers will treat your pain and side effects before you go home.  Do not drive for 24 hours if you received a sedative.  You may: ? Feel nauseous and vomit. ? Have a sore throat. ? Have mental slowness. ? Feel cold or shivery. ? Feel sleepy. ? Feel tired. ? Feel sore or achy, even in parts of your body where you did not have surgery. This information is not intended to replace advice given to you by your health care provider. Make sure you discuss any questions you have with your health care provider. Document Released: 10/28/2007 Document Revised:  01/01/2016 Document Reviewed: 07/05/2015 Elsevier Interactive Patient Education  2018 [place] Anesthesia, Adult, Care After These instructions provide you with information about caring for yourself after your procedure. Your health care provider may also give you more specific instructions. Your treatment has been planned according to current medical  practices, but problems sometimes occur. Call your health care provider if you have any problems or questions after your procedure. What can I expect after the procedure? After the procedure, it is common to have:  Vomiting.  A sore throat.  Mental slowness.  It is common to feel:  Nauseous.  Cold or shivery.  Sleepy.  Tired.  Sore or achy, even in parts of your body where you did not have surgery.  Follow these instructions at home: For at least 24 hours after the procedure:  Do not: ? Participate in activities where you could fall or become injured. ? Drive. ? Use heavy machinery. ? Drink alcohol. ? Take sleeping pills or medicines that cause drowsiness. ? Make important decisions or sign legal documents. ? Take care of children on your own.  Rest. Eating and drinking  If you vomit, drink water, juice, or soup when you can drink without vomiting.  Drink enough fluid to keep your urine clear or pale yellow.  Make sure you have little or no nausea before eating solid foods.  Follow the diet recommended by your health care provider. General instructions  Have a responsible adult stay with you until you are awake and alert.  Return to your normal activities as told by your health care provider. Ask your health care provider what activities are safe for you.  Take over-the-counter and prescription medicines only as told by your health care provider.  If you smoke, do not smoke without supervision.  Keep all follow-up visits as told by your health care provider. This is important. Contact a health care provider if:  You continue to have nausea or vomiting at home, and medicines are not helpful.  You cannot drink fluids or start eating again.  You cannot urinate after 8-12 hours.  You develop a skin rash.  You have fever.  You have increasing redness at the site of your procedure. Get help right away if:  You have difficulty breathing.  You have  chest pain.  You have unexpected bleeding.  You feel that you are having a life-threatening or urgent problem. This information is not intended to replace advice given to you by your health care provider. Make sure you discuss any questions you have with your health care provider. Document Released: 10/27/2000 Document Revised: 12/24/2015 Document Reviewed: 07/05/2015 Elsevier Interactive Patient Education  [employer].

## 2017-10-06 NOTE — H&P (Signed)
Tanner Dudley; [id number]; 1965/12/23   HPI Patient is a 52 year old black male who was recently diagnosed with rectal cancer.  He was referred to my care by oncology, Dr. Ursula Alert, for a diverting colostomy.  He has a near obstructing rectal cancer and is about to undergo radiation therapy.  His appetite is decreased.  He does have a pain of 2 out of 10 in the rectal region.  He constantly feels pressure.  He denies any recent emesis.  He denies any fever.  He is being treated for a urinary tract infection.  He has been started on Xeloda. Past Medical History:  Diagnosis Date  . Medical history non-contributory     Past Surgical History:  Procedure Laterality Date  . BIOPSY  09/16/2017   Procedure: BIOPSY;  Surgeon: Daneil Dolin, MD;  Location: [facility];  Service: Gastroenterology;;  anal rectal  . COLONOSCOPY WITH PROPOFOL N/A 09/16/2017   Procedure: COLONOSCOPY WITH PROPOFOL;  Surgeon: Daneil Dolin, MD;  Location: [facility];  Service: Gastroenterology;  Laterality: N/A;  . None      Family History  Problem Relation Age of Onset  . Colon cancer Father        diagnosed in his 55s, succumbed to disease  . Diabetes Mother   . Heart attack Mother   . Hypertension Mother   . Pancreatic cancer Sister   . Prostate cancer Brother   . Melanoma Brother   . Inflammatory bowel disease Neg Hx   . Colon polyps Neg Hx     Current Outpatient Medications on File Prior to Visit  Medication Sig Dispense Refill  . capecitabine (XELODA) 500 MG tablet 3 tablets po bid Monday through Friday with RT 180 tablet 0   No current facility-administered medications on file prior to visit.     No Known Allergies  Social History   Substance and Sexual Activity  Alcohol Use Yes   Comment: 2-3 beers, 3-4 times per week     Social History   Tobacco Use  Smoking Status Current Every Day Smoker  . Packs/day: 1.00  . Types: Cigarettes  . Start date: 08/14/1997  Smokeless Tobacco Never  Used    Review of Systems  Constitutional: Positive for chills and malaise/fatigue.  HENT: Negative.   Eyes: Positive for blurred vision.  Respiratory: Positive for shortness of breath.   Cardiovascular: Negative.   Gastrointestinal: Positive for heartburn.  Genitourinary: Negative.   Musculoskeletal: Positive for back pain.  Skin: Negative.   Neurological: Negative.   Endo/Heme/Allergies: Negative.   Psychiatric/Behavioral: Negative.     Objective   Vitals:   10/06/17 0952  BP: 126/89  Pulse: (!) 112  Temp: 99.3 F (37.4 C)    Physical Exam  Constitutional: He is oriented to person, place, and time.  Somewhat cachectic black male in no acute distress.  HENT:  Head: Normocephalic and atraumatic.  Cardiovascular: Normal rate, regular rhythm and normal heart sounds. Exam reveals no gallop and no friction rub.  No murmur heard. Pulmonary/Chest: Effort normal and breath sounds normal. No respiratory distress. He has no wheezes. He has no rales.  Abdominal: Soft. Bowel sounds are normal. He exhibits no distension. There is no tenderness. There is no rebound and no guarding.  Genitourinary:  Genitourinary Comments: Rectal examination deferred at this time.  Neurological: He is alert and oriented to person, place, and time.  Skin: Skin is warm and dry.  Vitals reviewed.  Oncology notes, PET scan report reviewed. Assessment  Near obstructing rectal cancer Plan   Patient is scheduled for a transverse loop diverting colostomy on 10/09/2017.  The risks and benefits of the procedure including bleeding, infection, and cardiopulmonary difficulties were fully explained to the patient, who gave informed consent.  He realizes that this is a palliative procedure.  No need for bowel prep due to obstructive nature of rectal cancer.

## 2017-10-06 NOTE — Patient Instructions (Signed)
Colostomy, Adult A colostomy is a surgical procedure that involves attaching part of the colon to the front of the abdomen (abdominal wall). The colon is the last part of the digestive tract. It is where water is absorbed from digested food to form stool (feces). A colostomy is done to redirect stool through an opening (stoma) in the abdominal wall. You may need this surgery if you have a medical condition that prevents stool from leaving your body through the usual opening (rectum). A bag will be attached to the stoma on the outside of your body. This bag will collect the stool and waste that is redirected through the stoma. A colostomy may be temporary or permanent. Tell a health care provider about:  Any allergies you have.  All medicines you are taking, including vitamins, herbs, eye drops, creams, and over-the-counter medicines.  Any problems you or family members have had with anesthetic medicines.  Any blood disorders you have.  Any surgeries you have had.  Any medical conditions you have.  Whether you are pregnant or may be pregnant. What are the risks? Generally, this is a safe procedure. However, problems may occur, including:  Infection.  Bleeding.  Allergic reactions to medicines.  Damage to other structures or organs.  Leaking of stool inside the abdomen.  Formation of scar tissue that causes a blockage.  What happens before the procedure?  Follow instructions from your health care provider about eating or drinking restrictions.  Ask your health care provider about: ? Changing or stopping your regular medicines. This is especially important if you are taking diabetes medicines or blood thinners. ? Taking medicines such as aspirin and ibuprofen. These medicines can thin your blood. Do not take these medicines before your procedure if your health care provider instructs you not to.  Do not use any tobacco products, such as cigarettes, chewing tobacco, and  e-cigarettes. If you need help quitting, ask your health care provider.  Plan to have someone take you home after the procedure.  You may have an exam or testing.  Ask your health care provider how your surgical site will be marked or identified.  You may be given antibiotic medicine to help prevent infection. What happens during the procedure?  To reduce your risk of infection: ? Your health care team will wash or sanitize their hands. ? Your skin will be washed with soap.  An IV tube will be inserted into one of your veins.  A drainage tube may be passed through your nose and into your stomach (NG tube, or nasogastric tube).  You may be given a medicine to help you relax (sedative).  You will be given a medicine to make you fall asleep (general anesthetic).  An incision will be made in the front of your abdomen.  The muscles under the skin will be divided or separated.  The next steps will vary depending on the type of colostomy. There are two main types: ? End colostomy. Part of the colon will be removed, or the colon will be divided into two separate parts. The end of the colon that is attached to the upper part of the digestive tract will be attached to the wall of the abdomen, creating a stoma. The other end will be closed off. ? Loop colostomy. A part of the colon will be pulled through the incision in the abdomen. Two openings will be made in the side of the colon. The colon will be attached to the skin at these openings,  creating the stoma.  Stitches (sutures) will be used to create the stoma and close the incision.  A colostomy bag will be placed over the stoma to collect stool and mucus. The procedure may vary among health care providers and hospitals. What happens after the procedure?  Your blood pressure, heart rate, breathing rate, and blood oxygen level will be monitored often until the medicines you were given have worn off.  You will be given pain medicine as  needed.  You will receive fluids and nutrition through an IV tube.  As soon as you are eating well and passing stool through the colostomy, the nasogastric tube and the IV tube may be removed.  Do not drive for 24 hours if you received a sedative. This information is not intended to replace advice given to you by your health care provider. Make sure you discuss any questions you have with your health care provider. Document Released: 12/11/2010 Document Revised: 12/27/2015 Document Reviewed: 04/03/2015 Elsevier Interactive Patient Education  2018 [employer].

## 2017-10-06 NOTE — Progress Notes (Signed)
Tanner Dudley; [id number]; [birth year]   HPI Patient is a 52 year old black male who was recently diagnosed with rectal cancer.  He was referred to my care by oncology, Dr. Ursula Alert, for a diverting colostomy.  He has a near obstructing rectal cancer and is about to undergo radiation therapy.  His appetite is decreased.  He does have a pain of 2 out of 10 in the rectal region.  He constantly feels pressure.  He denies any recent emesis.  He denies any fever.  He is being treated for a urinary tract infection.  He has been started on Xeloda. Past Medical History:  Diagnosis Date  . Medical history non-contributory     Past Surgical History:  Procedure Laterality Date  . BIOPSY  09/16/2017   Procedure: BIOPSY;  Surgeon: Daneil Dolin, MD;  Location: [facility];  Service: Gastroenterology;;  anal rectal  . COLONOSCOPY WITH PROPOFOL N/A 09/16/2017   Procedure: COLONOSCOPY WITH PROPOFOL;  Surgeon: Daneil Dolin, MD;  Location: [facility];  Service: Gastroenterology;  Laterality: N/A;  . None      Family History  Problem Relation Age of Onset  . Colon cancer Father        diagnosed in his 80s, succumbed to disease  . Diabetes Mother   . Heart attack Mother   . Hypertension Mother   . Pancreatic cancer Sister   . Prostate cancer Brother   . Melanoma Brother   . Inflammatory bowel disease Neg Hx   . Colon polyps Neg Hx     Current Outpatient Medications on File Prior to Visit  Medication Sig Dispense Refill  . capecitabine (XELODA) 500 MG tablet 3 tablets po bid Monday through Friday with RT 180 tablet 0   No current facility-administered medications on file prior to visit.     No Known Allergies  Social History   Substance and Sexual Activity  Alcohol Use Yes   Comment: 2-3 beers, 3-4 times per week     Social History   Tobacco Use  Smoking Status Current Every Day Smoker  . Packs/day: 1.00  . Types: Cigarettes  . Start date: 08/14/1997  Smokeless Tobacco Never  Used    Review of Systems  Constitutional: Positive for chills and malaise/fatigue.  HENT: Negative.   Eyes: Positive for blurred vision.  Respiratory: Positive for shortness of breath.   Cardiovascular: Negative.   Gastrointestinal: Positive for heartburn.  Genitourinary: Negative.   Musculoskeletal: Positive for back pain.  Skin: Negative.   Neurological: Negative.   Endo/Heme/Allergies: Negative.   Psychiatric/Behavioral: Negative.     Objective   Vitals:   10/06/17 0952  BP: 126/89  Pulse: (!) 112  Temp: 99.3 F (37.4 C)    Physical Exam  Constitutional: He is oriented to person, place, and time.  Somewhat cachectic black male in no acute distress.  HENT:  Head: Normocephalic and atraumatic.  Cardiovascular: Normal rate, regular rhythm and normal heart sounds. Exam reveals no gallop and no friction rub.  No murmur heard. Pulmonary/Chest: Effort normal and breath sounds normal. No respiratory distress. He has no wheezes. He has no rales.  Abdominal: Soft. Bowel sounds are normal. He exhibits no distension. There is no tenderness. There is no rebound and no guarding.  Genitourinary:  Genitourinary Comments: Rectal examination deferred at this time.  Neurological: He is alert and oriented to person, place, and time.  Skin: Skin is warm and dry.  Vitals reviewed.  Oncology notes, PET scan report reviewed. Assessment  Near obstructing rectal cancer Plan   Patient is scheduled for a transverse loop diverting colostomy on 10/09/2017.  The risks and benefits of the procedure including bleeding, infection, and cardiopulmonary difficulties were fully explained to the patient, who gave informed consent.  He realizes that this is a palliative procedure. No bowel prep required as patient has near obstructing rectal cancer.

## 2017-10-07 ENCOUNTER — Encounter (HOSPITAL_COMMUNITY): Payer: Self-pay

## 2017-10-07 ENCOUNTER — Encounter (HOSPITAL_COMMUNITY)
Admission: RE | Admit: 2017-10-07 | Discharge: 2017-10-07 | Disposition: A | Discharge status: Home / Self Care | Payer: Medicaid Other | Source: Ambulatory Visit | Attending: General Surgery | Admitting: General Surgery

## 2017-10-07 ENCOUNTER — Other Ambulatory Visit: Payer: Self-pay

## 2017-10-07 DIAGNOSIS — Z808 Family history of malignant neoplasm of other organs or systems: Secondary | ICD-10-CM | POA: Diagnosis not present

## 2017-10-07 DIAGNOSIS — Z800 Family history of malignant neoplasm of digestive organs: Secondary | ICD-10-CM | POA: Insufficient documentation

## 2017-10-07 DIAGNOSIS — Z833 Family history of diabetes mellitus: Secondary | ICD-10-CM | POA: Insufficient documentation

## 2017-10-07 DIAGNOSIS — Z79899 Other long term (current) drug therapy: Secondary | ICD-10-CM | POA: Diagnosis not present

## 2017-10-07 DIAGNOSIS — F17210 Nicotine dependence, cigarettes, uncomplicated: Secondary | ICD-10-CM | POA: Insufficient documentation

## 2017-10-07 DIAGNOSIS — C20 Malignant neoplasm of rectum: Secondary | ICD-10-CM | POA: Diagnosis not present

## 2017-10-07 DIAGNOSIS — Z8249 Family history of ischemic heart disease and other diseases of the circulatory system: Secondary | ICD-10-CM | POA: Insufficient documentation

## 2017-10-07 DIAGNOSIS — Z8042 Family history of malignant neoplasm of prostate: Secondary | ICD-10-CM | POA: Diagnosis not present

## 2017-10-07 DIAGNOSIS — Z01812 Encounter for preprocedural laboratory examination: Secondary | ICD-10-CM | POA: Insufficient documentation

## 2017-10-07 DIAGNOSIS — Z0183 Encounter for blood typing: Secondary | ICD-10-CM | POA: Insufficient documentation

## 2017-10-07 HISTORY — DX: Unspecified hearing loss, unspecified ear: H91.90

## 2017-10-07 LAB — CBC WITH DIFFERENTIAL/PLATELET
Basophils Absolute: 0.2 10*3/uL — ABNORMAL HIGH (ref 0.0–0.1)
Basophils Relative: 4 %
Eosinophils Absolute: 0.1 10*3/uL (ref 0.0–0.7)
Eosinophils Relative: 1 %
HEMATOCRIT: 38.3 % — AB (ref 39.0–52.0)
Hemoglobin: 12.2 g/dL — ABNORMAL LOW (ref 13.0–17.0)
Lymphocytes Relative: 27 %
Lymphs Abs: 1.7 10*3/uL (ref 0.7–4.0)
MCH: 26.6 pg (ref 26.0–34.0)
MCHC: 31.9 g/dL (ref 30.0–36.0)
MCV: 83.6 fL (ref 78.0–100.0)
MONO ABS: 1 10*3/uL (ref 0.1–1.0)
MONOS PCT: 16 %
NEUTROS ABS: 3.2 10*3/uL (ref 1.7–7.7)
Neutrophils Relative %: 52 %
Platelets: 620 10*3/uL — ABNORMAL HIGH (ref 150–400)
RBC: 4.58 MIL/uL (ref 4.22–5.81)
RDW: 14.9 % (ref 11.5–15.5)
WBC: 6.1 10*3/uL (ref 4.0–10.5)

## 2017-10-07 LAB — BASIC METABOLIC PANEL
ANION GAP: 14 (ref 5–15)
BUN: 9 mg/dL (ref 6–20)
CHLORIDE: 101 mmol/L (ref 101–111)
CO2: 23 mmol/L (ref 22–32)
Calcium: 9.1 mg/dL (ref 8.9–10.3)
Creatinine, Ser: 1.06 mg/dL (ref 0.61–1.24)
GFR calc Af Amer: >60 mL/min (ref >60)
GFR calc non Af Amer: >60 mL/min (ref >60)
GLUCOSE: 77 mg/dL (ref 65–99)
POTASSIUM: 4.4 mmol/L (ref 3.5–5.1)
Sodium: 138 mmol/L (ref 135–145)

## 2017-10-07 LAB — TYPE AND SCREEN
ABO/RH(D): O POS
Antibody Screen: NEGATIVE

## 2017-10-08 ENCOUNTER — Telehealth (HOSPITAL_COMMUNITY): Payer: Self-pay | Admitting: *Deleted

## 2017-10-08 ENCOUNTER — Ambulatory Visit (HOSPITAL_COMMUNITY): Admission: RE | Admit: 2017-10-08 | Payer: Self-pay | Source: Ambulatory Visit

## 2017-10-08 NOTE — Telephone Encounter (Signed)
I spoke with [facility] to be sure patient was going to follow up with their office for medical/oncology treatment.  Albina Billet advised me that at this time they did not know that he was going to come there for med/onc treatment, they just have him for radiation.  The patient is currently in the ER there at [facility].   I spoke with Dr. Nita Sickle in the ER and he states that patient was seen for anxiety and SOB and from his standpoint nothing infectious.  He said that he feels like he can proceed with surgery tomorrow as planned.    I called Dr. Arnoldo Morale office to let them know the above and they will notify Arnoldo Morale.    Dr. Walden Field aware that patient is not going to be following up here in our office.

## 2017-10-09 ENCOUNTER — Inpatient Hospital Stay (HOSPITAL_COMMUNITY): Payer: Medicaid Other | Admitting: Anesthesiology

## 2017-10-09 ENCOUNTER — Other Ambulatory Visit: Payer: Self-pay

## 2017-10-09 ENCOUNTER — Encounter (HOSPITAL_COMMUNITY): Payer: Self-pay | Admitting: General Surgery

## 2017-10-09 ENCOUNTER — Encounter (HOSPITAL_COMMUNITY)
Admission: RE | Disposition: A | Discharge status: Home Health | Payer: Self-pay | Source: Ambulatory Visit | Attending: General Surgery

## 2017-10-09 ENCOUNTER — Inpatient Hospital Stay (HOSPITAL_COMMUNITY)
Admission: RE | Admit: 2017-10-09 | Discharge: 2017-10-13 | DRG: 331 | Disposition: A | Discharge status: Home Health | Payer: Medicaid Other | Source: Ambulatory Visit | Attending: General Surgery | Admitting: General Surgery

## 2017-10-09 DIAGNOSIS — E860 Dehydration: Secondary | ICD-10-CM | POA: Diagnosis not present

## 2017-10-09 DIAGNOSIS — Z8042 Family history of malignant neoplasm of prostate: Secondary | ICD-10-CM | POA: Diagnosis not present

## 2017-10-09 DIAGNOSIS — Z800 Family history of malignant neoplasm of digestive organs: Secondary | ICD-10-CM

## 2017-10-09 DIAGNOSIS — C20 Malignant neoplasm of rectum: Principal | ICD-10-CM

## 2017-10-09 DIAGNOSIS — Z79899 Other long term (current) drug therapy: Secondary | ICD-10-CM | POA: Diagnosis not present

## 2017-10-09 DIAGNOSIS — F17210 Nicotine dependence, cigarettes, uncomplicated: Secondary | ICD-10-CM | POA: Diagnosis not present

## 2017-10-09 DIAGNOSIS — Z933 Colostomy status: Secondary | ICD-10-CM

## 2017-10-09 DIAGNOSIS — Z808 Family history of malignant neoplasm of other organs or systems: Secondary | ICD-10-CM | POA: Diagnosis not present

## 2017-10-09 HISTORY — DX: Malignant neoplasm of rectum: C20

## 2017-10-09 HISTORY — PX: COLOSTOMY: SHX63

## 2017-10-09 SURGERY — CREATION, COLOSTOMY
Anesthesia: General | Site: Abdomen

## 2017-10-09 MED ORDER — ESCITALOPRAM OXALATE 10 MG PO TABS
10.0000 mg | ORAL_TABLET | Freq: Every day | ORAL | Status: DC
  Administered 2017-10-09 – 2017-10-13 (×5): 10 mg via ORAL
  Filled 2017-10-09 (×5): qty 1

## 2017-10-09 MED ORDER — SEVOFLURANE IN SOLN
RESPIRATORY_TRACT | Status: AC
  Filled 2017-10-09: qty 250

## 2017-10-09 MED ORDER — SODIUM CHLORIDE 0.9 % IR SOLN
Status: DC | PRN
  Administered 2017-10-09: 1000 mL

## 2017-10-09 MED ORDER — MIDAZOLAM HCL 2 MG/2ML IJ SOLN
1.0000 mg | INTRAMUSCULAR | Status: AC
  Administered 2017-10-09 (×2): 2 mg via INTRAVENOUS
  Filled 2017-10-09: qty 2

## 2017-10-09 MED ORDER — DIPHENHYDRAMINE HCL 25 MG PO CAPS: 25.0000 mg | ORAL_CAPSULE | Freq: Four times a day (QID) | ORAL | Status: DC | PRN

## 2017-10-09 MED ORDER — ENSURE ENLIVE PO LIQD: 237.0000 mL | Freq: Two times a day (BID) | ORAL | Status: DC

## 2017-10-09 MED ORDER — KETOROLAC TROMETHAMINE 30 MG/ML IJ SOLN
30.0000 mg | Freq: Four times a day (QID) | INTRAMUSCULAR | Status: DC
  Administered 2017-10-09 – 2017-10-13 (×15): 30 mg via INTRAVENOUS
  Filled 2017-10-09 (×15): qty 1

## 2017-10-09 MED ORDER — ENOXAPARIN SODIUM 40 MG/0.4ML ~~LOC~~ SOLN
40.0000 mg | Freq: Once | SUBCUTANEOUS | Status: AC
  Administered 2017-10-09: 40 mg via SUBCUTANEOUS
  Filled 2017-10-09: qty 0.4

## 2017-10-09 MED ORDER — LABETALOL HCL 5 MG/ML IV SOLN
INTRAVENOUS | Status: DC | PRN
  Administered 2017-10-09: 5 mg via INTRAVENOUS

## 2017-10-09 MED ORDER — FENTANYL CITRATE (PF) 100 MCG/2ML IJ SOLN
INTRAMUSCULAR | Status: DC | PRN
  Administered 2017-10-09: 50 ug via INTRAVENOUS
  Administered 2017-10-09: 100 ug via INTRAVENOUS
  Administered 2017-10-09: 50 ug via INTRAVENOUS

## 2017-10-09 MED ORDER — ONDANSETRON HCL 4 MG/2ML IJ SOLN: 4.0000 mg | Freq: Four times a day (QID) | INTRAMUSCULAR | Status: DC | PRN

## 2017-10-09 MED ORDER — MIDAZOLAM HCL 2 MG/2ML IJ SOLN
INTRAMUSCULAR | Status: AC
  Filled 2017-10-09: qty 2

## 2017-10-09 MED ORDER — FENTANYL CITRATE (PF) 250 MCG/5ML IJ SOLN
INTRAMUSCULAR | Status: AC
  Filled 2017-10-09: qty 5

## 2017-10-09 MED ORDER — SODIUM CHLORIDE 0.9 % IV SOLN
1.0000 g | INTRAVENOUS | Status: AC
  Administered 2017-10-09: 1 g via INTRAVENOUS
  Filled 2017-10-09: qty 1

## 2017-10-09 MED ORDER — FENTANYL CITRATE (PF) 100 MCG/2ML IJ SOLN: 25.0000 ug | INTRAMUSCULAR | Status: DC | PRN

## 2017-10-09 MED ORDER — ONDANSETRON HCL 4 MG/2ML IJ SOLN
4.0000 mg | Freq: Once | INTRAMUSCULAR | Status: AC
  Administered 2017-10-09: 4 mg via INTRAVENOUS

## 2017-10-09 MED ORDER — ONDANSETRON 4 MG PO TBDP
4.0000 mg | ORAL_TABLET | Freq: Four times a day (QID) | ORAL | Status: DC | PRN
Start: 2017-10-09 — End: 2017-10-13

## 2017-10-09 MED ORDER — LEVOFLOXACIN 250 MG PO TABS
250.0000 mg | ORAL_TABLET | Freq: Every day | ORAL | Status: DC
  Administered 2017-10-09 – 2017-10-13 (×5): 250 mg via ORAL
  Filled 2017-10-09 (×5): qty 1

## 2017-10-09 MED ORDER — BUPIVACAINE LIPOSOME 1.3 % IJ SUSP
INTRAMUSCULAR | Status: AC
  Filled 2017-10-09: qty 20

## 2017-10-09 MED ORDER — ROCURONIUM BROMIDE 100 MG/10ML IV SOLN
INTRAVENOUS | Status: DC | PRN
  Administered 2017-10-09: 5 mg via INTRAVENOUS
  Administered 2017-10-09: 35 mg via INTRAVENOUS

## 2017-10-09 MED ORDER — BUPIVACAINE LIPOSOME 1.3 % IJ SUSP
INTRAMUSCULAR | Status: DC | PRN
  Administered 2017-10-09: 14 mL

## 2017-10-09 MED ORDER — ACETAMINOPHEN 650 MG RE SUPP: 650.0000 mg | Freq: Four times a day (QID) | RECTAL | Status: DC | PRN

## 2017-10-09 MED ORDER — SIMETHICONE 80 MG PO CHEW
40.0000 mg | CHEWABLE_TABLET | Freq: Four times a day (QID) | ORAL | Status: DC | PRN
  Administered 2017-10-10: 40 mg via ORAL
  Filled 2017-10-09: qty 1

## 2017-10-09 MED ORDER — SUGAMMADEX SODIUM 500 MG/5ML IV SOLN
INTRAVENOUS | Status: DC | PRN
  Administered 2017-10-09: 300 mg via INTRAVENOUS

## 2017-10-09 MED ORDER — SUGAMMADEX SODIUM 500 MG/5ML IV SOLN
INTRAVENOUS | Status: AC
  Filled 2017-10-09: qty 5

## 2017-10-09 MED ORDER — ROCURONIUM BROMIDE 50 MG/5ML IV SOLN
INTRAVENOUS | Status: AC
  Filled 2017-10-09: qty 1

## 2017-10-09 MED ORDER — LACTATED RINGERS IV BOLUS (SEPSIS)
500.0000 mL | Freq: Once | INTRAVENOUS | Status: AC
  Administered 2017-10-09: 500 mL via INTRAVENOUS

## 2017-10-09 MED ORDER — LACTATED RINGERS IV SOLN
INTRAVENOUS | Status: DC
  Administered 2017-10-09 – 2017-10-13 (×6): via INTRAVENOUS

## 2017-10-09 MED ORDER — DIPHENHYDRAMINE HCL 50 MG/ML IJ SOLN: 25.0000 mg | Freq: Four times a day (QID) | INTRAMUSCULAR | Status: DC | PRN

## 2017-10-09 MED ORDER — LABETALOL HCL 5 MG/ML IV SOLN
INTRAVENOUS | Status: AC
  Filled 2017-10-09: qty 4

## 2017-10-09 MED ORDER — ONDANSETRON HCL 4 MG/2ML IJ SOLN
INTRAMUSCULAR | Status: AC
  Filled 2017-10-09: qty 2

## 2017-10-09 MED ORDER — LACTATED RINGERS IV SOLN
INTRAVENOUS | Status: DC
  Administered 2017-10-09: 09:00:00 via INTRAVENOUS

## 2017-10-09 MED ORDER — ENOXAPARIN SODIUM 40 MG/0.4ML ~~LOC~~ SOLN
40.0000 mg | SUBCUTANEOUS | Status: DC
  Administered 2017-10-10 – 2017-10-12 (×3): 40 mg via SUBCUTANEOUS
  Filled 2017-10-09 (×4): qty 0.4

## 2017-10-09 MED ORDER — LORAZEPAM 2 MG/ML IJ SOLN
0.5000 mg | INTRAMUSCULAR | Status: DC | PRN
  Administered 2017-10-10 – 2017-10-12 (×3): 0.5 mg via INTRAVENOUS
  Filled 2017-10-09 (×3): qty 1

## 2017-10-09 MED ORDER — ACETAMINOPHEN 325 MG PO TABS: 650.0000 mg | ORAL_TABLET | Freq: Four times a day (QID) | ORAL | Status: DC | PRN

## 2017-10-09 MED ORDER — OXYCODONE-ACETAMINOPHEN 5-325 MG PO TABS: 1.0000 | ORAL_TABLET | ORAL | Status: DC | PRN

## 2017-10-09 MED ORDER — PROPOFOL 10 MG/ML IV BOLUS
INTRAVENOUS | Status: DC | PRN
  Administered 2017-10-09: 150 mg via INTRAVENOUS
  Administered 2017-10-09: 20 mg via INTRAVENOUS
  Administered 2017-10-09: 30 mg via INTRAVENOUS

## 2017-10-09 MED ORDER — KETOROLAC TROMETHAMINE 30 MG/ML IJ SOLN: 30.0000 mg | Freq: Four times a day (QID) | INTRAMUSCULAR | Status: DC | PRN

## 2017-10-09 MED ORDER — HYDROMORPHONE HCL 1 MG/ML IJ SOLN: 1.0000 mg | INTRAMUSCULAR | Status: DC | PRN

## 2017-10-09 SURGICAL SUPPLY — 24 items
BAG HAMPER (MISCELLANEOUS) ×3 IMPLANT
BAG UROSTOMY 4 LOOP 90 STRL (OSTOMY) ×2 IMPLANT
BAG UROSTOMY 4 LOOP 90MM STER (OSTOMY) ×1
BENZOIN TINCTURE PRP APPL 2/3 (GAUZE/BANDAGES/DRESSINGS) ×3 IMPLANT
CHLORAPREP W/TINT 26ML (MISCELLANEOUS) ×3 IMPLANT
CLOTH BEACON ORANGE TIMEOUT ST (SAFETY) ×3 IMPLANT
COVER LIGHT HANDLE STERIS (MISCELLANEOUS) ×6 IMPLANT
DRAIN PENROSE 18X1/2 LTX STRL (DRAIN) ×3 IMPLANT
ELECT REM PT RETURN 9FT ADLT (ELECTROSURGICAL) ×3
ELECTRODE REM PT RTRN 9FT ADLT (ELECTROSURGICAL) ×1 IMPLANT
GLOVE BIOGEL PI IND STRL 7.0 (GLOVE) ×1 IMPLANT
GLOVE BIOGEL PI INDICATOR 7.0 (GLOVE) ×2
GLOVE SURG SS PI 7.5 STRL IVOR (GLOVE) ×6 IMPLANT
GOWN STRL REUS W/TWL LRG LVL3 (GOWN DISPOSABLE) ×9 IMPLANT
INST SET MAJOR GENERAL (KITS) ×3 IMPLANT
KIT TURNOVER KIT A (KITS) ×3 IMPLANT
MANIFOLD NEPTUNE II (INSTRUMENTS) ×3 IMPLANT
NS IRRIG 1000ML POUR BTL (IV SOLUTION) ×3 IMPLANT
PACK ABDOMINAL MAJOR (CUSTOM PROCEDURE TRAY) ×3 IMPLANT
PAD ARMBOARD 7.5X6 YLW CONV (MISCELLANEOUS) ×3 IMPLANT
STAPLER VISISTAT (STAPLE) ×3 IMPLANT
SUT CHROMIC 3 0 PS 2 (SUTURE) ×6 IMPLANT
SUT SILK 3 0 SH CR/8 (SUTURE) ×6 IMPLANT
SUT VICRYL AB 3-0 FS1 BRD 27IN (SUTURE) IMPLANT

## 2017-10-09 NOTE — Anesthesia Procedure Notes (Signed)
Procedure Name: Intubation Date/Time: 10/09/2017 9:46 AM Performed by: Vista Deck, CRNA Pre-anesthesia Checklist: Patient identified, Patient being monitored, Timeout performed, Emergency Drugs available and Suction available Patient Re-evaluated:Patient Re-evaluated prior to induction Oxygen Delivery Method: Circle System Utilized Preoxygenation: Pre-oxygenation with 100% oxygen Induction Type: IV induction Ventilation: Mask ventilation without difficulty Laryngoscope Size: Mac and 3 Grade View: Grade II Tube type: Oral Tube size: 7.0 mm Number of attempts: 1 Airway Equipment and Method: stylet and Oral airway Placement Confirmation: ETT inserted through vocal cords under direct vision,  positive ETCO2 and breath sounds checked- equal and bilateral Secured at: 22 cm Tube secured with: Tape Dental Injury: Teeth and Oropharynx as per pre-operative assessment

## 2017-10-09 NOTE — Op Note (Signed)
Patient:  Tanner Dudley  DOB:  05/30/1966  MRN:  [id number]   Preop Diagnosis: Near obstructing rectal carcinoma  Postop Diagnosis: Same  Procedure: Diverting transverse loop colostomy  (palliative)  Surgeon: Aviva Signs, MD  Anes: General endotracheal  Indications: Patient is a 52 year old black male who is undergoing neoadjuvant therapy for near obstructing rectal carcinoma.  He was referred for a diverting loop colostomy as he is about to become totally obstructed.  The risks and benefits of the procedure including bleeding, infection, and the palliative nature of the procedure were fully explained to the patient, who gave informed consent.  Procedure note: The patient was placed in the supine position.  After induction of general endotracheal anesthesia, the abdomen was prepped and draped using the usual sterile technique with ChloraPrep.  Surgical site confirmation was performed.  A transverse incision was made midway between the xiphoid process and the umbilicus.  The fascia was then incised longitudinally.  The peritoneal cavity was entered into without difficulty.  The transverse colon was identified and brought out through the incision.  A retaining bar was then placed through the mesentery.  3-0 chromic gut interrupted sutures were then placed along the lateral edges of the of the colon to secured to the fascia.  Exparel was instilled into the surrounding wound.  A patent proximal and distal lumens were noted.  The ostomy appliance was then applied.  A longitudinal incision was made over the loop colostomy.  The ostomy bag was then applied.  All tape and needle counts were correct at the end of the procedure.  Patient was extubated and transferred to the PACU in stable condition.  Complications: None  EBL: Minimal  Specimen: None

## 2017-10-09 NOTE — Anesthesia Preprocedure Evaluation (Signed)
Anesthesia Evaluation  Patient identified by MRN, date of birth, ID band Patient awake    Reviewed: Allergy & Precautions, NPO status , Patient's Chart, lab work & pertinent test results  Airway Mallampati: I  TM Distance: >3 FB Neck ROM: Full    Dental  (+) Teeth Intact   Pulmonary Current Smoker,    breath sounds clear to auscultation       Cardiovascular negative cardio ROS   Rhythm:Regular Rate:Normal     Neuro/Psych negative neurological ROS  negative psych ROS   GI/Hepatic (+)     substance abuse ( 2-3 beers, 3-4 times per week )  alcohol use, Elevated transaminases Obstructing rectal cancer    Endo/Other  negative endocrine ROS  Renal/GU negative Renal ROS     Musculoskeletal   Abdominal   Peds  Hematology negative hematology ROS (+)   Anesthesia Other Findings   Reproductive/Obstetrics                             Anesthesia Physical Anesthesia Plan  ASA: II  Anesthesia Plan: General   Post-op Pain Management:    Induction: Intravenous  PONV Risk Score and Plan:   Airway Management Planned: Oral ETT  Additional Equipment:   Intra-op Plan:   Post-operative Plan: Extubation in OR  Informed Consent: I have reviewed the patients History and Physical, chart, labs and discussed the procedure including the risks, benefits and alternatives for the proposed anesthesia with the patient or authorized representative who has indicated his/her understanding and acceptance.     Plan Discussed with:   Anesthesia Plan Comments:         Anesthesia Quick Evaluation

## 2017-10-09 NOTE — Interval H&P Note (Signed)
History and Physical Interval Note:  10/09/2017 7:58 AM  Tanner Dudley  has presented today for surgery, with the diagnosis of rectal cancer  The various methods of treatment have been discussed with the patient and family. After consideration of risks, benefits and other options for treatment, the patient has consented to  Procedure(s): LOOP COLOSTOMY (N/A) as a surgical intervention .  The patient's history has been reviewed, patient examined, no change in status, stable for surgery.  I have reviewed the patient's chart and labs.  Questions were answered to the patient's satisfaction.     Aviva Signs

## 2017-10-09 NOTE — Progress Notes (Signed)
Patient only void 25 cc's, at this time, Dr Arnoldo Morale notified. Orders received and given. Will continue to monitor patient.

## 2017-10-09 NOTE — Consult Note (Addendum)
[place] Nurse ostomy consult note Pt had surgery this am for colostomy placement. [place] team will plan to begin pouch change demonstrations and ostomy educational sessions on Monday, 3/11. Supplies ordered to the room for staff nurse use. Julien Girt MSN, RN, [place], [place], [place]

## 2017-10-09 NOTE — Anesthesia Postprocedure Evaluation (Signed)
Anesthesia Post Note  Patient: Financial trader  Procedure(s) Performed: DIVERTING LOOP COLOSTOMY (PALLATIVE) (N/A Abdomen)  Patient location during evaluation: PACU Anesthesia Type: General Level of consciousness: awake and alert and patient cooperative Pain management: satisfactory to patient Vital Signs Assessment: post-procedure vital signs reviewed and stable Respiratory status: spontaneous breathing Cardiovascular status: stable Postop Assessment: no apparent nausea or vomiting Anesthetic complications: no     Last Vitals:  Vitals:   10/09/17 1100 10/09/17 1115  BP: (!) 140/102 (!) 135/97  Pulse: 76 76  Resp: 11 14  Temp:    SpO2: 98% 95%    Last Pain:  Vitals:   10/09/17 1039  TempSrc:   PainSc: 4                  Latausha Flamm

## 2017-10-09 NOTE — Care Management Note (Addendum)
Case Management Note  Patient Details  Name: Tanner Dudley MRN: [id number] Date of Birth: [birth year]  Subjective/Objective:    Patient having colostomy today. Discussed with Dr. Arnoldo Morale. Patient will need [facility] RN for teaching and colostomy care. Patient does not have insurance currenlty, therefore will need charity HH provided by [facility]. Potential DC over weekend per Dr. Arnoldo Morale.                Action/Plan: Juliann Pulse of [facility] notified of need for [facility] RN and follow for potential DC over the weekend. Patient does not have a PCP, Dr. Arnoldo Morale will be following for [facility] orders.  Expected Discharge Date:   unk            Expected Discharge Plan:  [place]  In-House Referral:     Discharge planning Services  CM Consult  Post Acute Care Choice:  Home Health Choice offered to:  Patient  DME Arranged:    DME Agency:     HH Arranged:  RN [place] Agency:  [place]  Status of Service:  In process, will continue to follow  If discussed at Long Length of Stay Meetings, dates discussed:    Additional Comments:  Hollyanne Schloesser, Chauncey [place], RN 10/09/2017, 12:03 PM

## 2017-10-09 NOTE — Transfer of Care (Signed)
Immediate Anesthesia Transfer of Care Note  Patient: Tanner Dudley  Procedure(s) Performed: DIVERTING LOOP COLOSTOMY (PALLATIVE) (N/A Abdomen)  Patient Location: PACU  Anesthesia Type:General  Level of Consciousness: awake, alert  and patient cooperative  Airway & Oxygen Therapy: Patient Spontanous Breathing and non-rebreather face mask  Post-op Assessment: Report given to RN and Post -op Vital signs reviewed and stable  Post vital signs: Reviewed and stable  Last Vitals:  Vitals:   10/09/17 0925 10/09/17 0927  BP:  (!) 136/95  Pulse:    Resp: (!) 27 (!) 28  Temp:    SpO2: 96% 96%    Last Pain:  Vitals:   10/09/17 0807  TempSrc: Oral      Patients Stated Pain Goal: 6 (15/05/69 7948)  Complications: No apparent anesthesia complications

## 2017-10-10 LAB — BASIC METABOLIC PANEL
ANION GAP: 10 (ref 5–15)
BUN: 8 mg/dL (ref 6–20)
CALCIUM: 8.7 mg/dL — AB (ref 8.9–10.3)
CO2: 25 mmol/L (ref 22–32)
Chloride: 105 mmol/L (ref 101–111)
Creatinine, Ser: 0.83 mg/dL (ref 0.61–1.24)
GFR calc non Af Amer: >60 mL/min (ref >60)
Glucose, Bld: 95 mg/dL (ref 65–99)
Potassium: 4.1 mmol/L (ref 3.5–5.1)
Sodium: 140 mmol/L (ref 135–145)

## 2017-10-10 LAB — CBC
HEMATOCRIT: 31.6 % — AB (ref 39.0–52.0)
Hemoglobin: 10 g/dL — ABNORMAL LOW (ref 13.0–17.0)
MCH: 26.7 pg (ref 26.0–34.0)
MCHC: 31.6 g/dL (ref 30.0–36.0)
MCV: 84.5 fL (ref 78.0–100.0)
Platelets: 469 10*3/uL — ABNORMAL HIGH (ref 150–400)
RBC: 3.74 MIL/uL — ABNORMAL LOW (ref 4.22–5.81)
RDW: 14.9 % (ref 11.5–15.5)
WBC: 5.8 10*3/uL (ref 4.0–10.5)

## 2017-10-10 LAB — MAGNESIUM: MAGNESIUM: 1.8 mg/dL (ref 1.7–2.4)

## 2017-10-10 LAB — PHOSPHORUS: PHOSPHORUS: 4 mg/dL (ref 2.5–4.6)

## 2017-10-10 NOTE — Progress Notes (Signed)
Initial Nutrition Assessment  DOCUMENTATION CODES:  Not applicable  INTERVENTION:  Continue Ensure Enlive po BID, each supplement provides 350 kcal and 20 grams of protein  RD to follow up next week if patient still admitted. If discharged, pt should be referred to RD at cancer center for nutrition ed given already sig wt loss and reported decreased intake.  NUTRITION DIAGNOSIS:  Increased nutrient needs related to cancer and cancer related treatments, post-op healing as evidenced by estimated nutritional requirements for these conditions/therapies  GOAL:  Patient will meet greater than or equal to 90% of their needs  MONITOR:  PO intake, Supplement acceptance, Labs, Weight trends  REASON FOR ASSESSMENT:  Malnutrition Screening Tool    ASSESSMENT:  52 y/o male who had presented to ED approximately 1 month ago w/ rectal bleeding and diarrhea x2 weeks. Colonoscopy revealed anal neoplasm. He was referred to rad/med oncology and planned for preoperative chemoradiation followed by resection. However, d/t size of tumour, patient referred to surgery for diverting colostomy due to near obstructing tumor. He is now POD1 loop colostomy.   RD operating remotely. Per chart review, the patient endorsed weight loss prior to his presentation in February, though could not find a reported UBW or how much he had lost. He also reports a decreased appetite.   He has already had unanticipated setbacks, including this need for more emergent surgery as well as current UTI for which he is being treated. As such, he is at high risk for malnutrition, if not already malnourished. Will order diet supplements, monitor intake and will follow up with next week inpatient, if still admitted. If he is discharged he can be seen by RD outpatient.   Per chart, he presented to ED in Feb at weight of 142.6 lbs. He presented at his outpatient surgery appt at a weight of 134 lbs, indicating a significant weight loss of >5% x1  month. He is currently 148.6 lbs, but he is POD1 surgery.   Physical Exam: Unable to assess  Labs: H/H:10/31.6 Meds: Ensure Enlive, PO abx, Pain medication  Recent Labs  Lab 10/07/17 1446 10/10/17 0421  NA 138 140  K 4.4 4.1  CL 101 105  CO2 23 25  BUN 9 8  CREATININE 1.06 0.83  CALCIUM 9.1 8.7*  MG  --  1.8  PHOS  --  4.0  GLUCOSE 77 95   NUTRITION - FOCUSED PHYSICAL EXAM: Unable to assess  Diet Order:  Diet regular Room service appropriate? Yes; Fluid consistency: Thin  EDUCATION NEEDS:  No education needs have been identified at this time  Skin:Surgical incision to abdomen  Last BM:  3/8-new diverting loop colostomy  Height:  Ht Readings from Last 1 Encounters:  10/09/17 5\' 8"  (1.727 m)   Weight:  Wt Readings from Last 1 Encounters:  10/09/17 148 lb 9.4 oz (67.4 kg)   Wt Readings from Last 10 Encounters:  10/09/17 148 lb 9.4 oz (67.4 kg)  10/07/17 137 lb (62.1 kg)  10/06/17 134 lb (60.8 kg)  10/01/17 135 lb 3.2 oz (61.3 kg)  09/24/17 139 lb 4.8 oz (63.2 kg)  09/17/17 139 lb 15.9 oz (63.5 kg)   Ideal Body Weight:  70 kg  BMI:  Body mass index is 22.59 kg/m.  Dosing weight: 60.8 kg Estimated Nutritional Needs:  Kcal:  1950-2150 (32-35 kcal/kg bw) Protein:  85-100g (1.4-1.6 g/kg bw) Fluid:  >2.1 L (35 ml/kg bw)  Burtis Junes RD, LDN, CNSC Clinical Nutrition Pager: [phone] 10/10/2017 9:13 AM

## 2017-10-10 NOTE — Anesthesia Postprocedure Evaluation (Signed)
Anesthesia Post Note  Patient: Tanner Dudley  Procedure(s) Performed: DIVERTING LOOP COLOSTOMY (PALLATIVE) (N/A Abdomen)  Patient location during evaluation: Nursing Unit Anesthesia Type: General Level of consciousness: awake and alert and oriented Pain management: pain level controlled Vital Signs Assessment: post-procedure vital signs reviewed and stable Respiratory status: spontaneous breathing Cardiovascular status: blood pressure returned to baseline Postop Assessment: no apparent nausea or vomiting Anesthetic complications: no     Last Vitals:  Vitals:   10/10/17 0500 10/10/17 0838  BP: 130/85 130/83  Pulse: 75 76  Resp: 18   Temp: 37 C 36.6 C  SpO2: 98% 98%    Last Pain:  Vitals:   10/10/17 0838  TempSrc: Oral  PainSc:                  Tressie Stalker

## 2017-10-10 NOTE — Progress Notes (Signed)
1 Day Post-Op  Subjective: Patient has minimal abdominal pain.  No nausea or vomiting noted.  Objective: Vital signs in last 24 hours: Temp:  [97.8 F (36.6 C)-98.7 F (37.1 C)] 97.8 F (36.6 C) (03/09 0838) Pulse Rate:  [72-99] 76 (03/09 0838) Resp:  [11-18] 18 (03/09 0500) BP: (120-145)/(80-113) 130/83 (03/09 0838) SpO2:  [93 %-100 %] 98 % (03/09 0838) Weight:  [148 lb 9.4 oz (67.4 kg)] 148 lb 9.4 oz (67.4 kg) (03/08 1850) Last BM Date: 10/09/17  Intake/Output from previous day: 03/08 0701 - 03/09 0700 In: 2652.5 [P.O.:360; I.V.:2217.5; IV Piggyback:75] Out: 801 [Urine:300; Stool:225] Intake/Output this shift: No intake/output data recorded.  General appearance: alert, cooperative and no distress Resp: clear to auscultation bilaterally Cardio: regular rate and rhythm, S1, S2 normal, no murmur, click, rub or gallop GI: soft, non-tender; bowel sounds normal; no masses,  no organomegaly and Ostomy patent.  Lab Results:  Recent Labs    10/07/17 1446 10/10/17 0421  WBC 6.1 5.8  HGB 12.2* 10.0*  HCT 38.3* 31.6*  PLT 620* 469*   BMET Recent Labs    10/07/17 1446 10/10/17 0421  NA 138 140  K 4.4 4.1  CL 101 105  CO2 23 25  GLUCOSE 77 95  BUN 9 8  CREATININE 1.06 0.83  CALCIUM 9.1 8.7*   PT/INR No results for input(s): LABPROT, INR in the last 72 hours.  Studies/Results: No results found.  Anti-infectives: Anti-infectives (From admission, onward)   Start     Dose/Rate Route Frequency Ordered Stop   10/09/17 1200  levofloxacin (LEVAQUIN) tablet 250 mg     250 mg Oral Daily 10/09/17 1146     10/09/17 1000  ertapenem (INVANZ) 1 g in sodium chloride 0.9 % 100 mL IVPB     1 g 200 mL/hr over 30 Minutes Intravenous On call to O.R. 10/09/17 0756 10/09/17 1005      Assessment/Plan: s/p Procedure(s): DIVERTING LOOP COLOSTOMY (PALLATIVE) Impression: Doing well on postoperative day 1.  Diverting colostomy is already functioning.  Patient still mildly dehydrated  with low urine output and will continue IV fluids.  Is already eating regular diet.  Ostomy care consult appreciated.  LOS: 1 day    Aviva Signs 10/10/2017

## 2017-10-10 NOTE — Addendum Note (Signed)
Addendum  created 10/10/17 0936 by Ollen Bowl, CRNA   Sign clinical note

## 2017-10-11 LAB — BASIC METABOLIC PANEL
ANION GAP: 7 (ref 5–15)
BUN: 8 mg/dL (ref 6–20)
CALCIUM: 8.5 mg/dL — AB (ref 8.9–10.3)
CO2: 27 mmol/L (ref 22–32)
Chloride: 106 mmol/L (ref 101–111)
Creatinine, Ser: 0.74 mg/dL (ref 0.61–1.24)
GFR calc Af Amer: >60 mL/min (ref >60)
GLUCOSE: 89 mg/dL (ref 65–99)
Potassium: 3.8 mmol/L (ref 3.5–5.1)
Sodium: 140 mmol/L (ref 135–145)

## 2017-10-11 LAB — CBC
HCT: 31.1 % — ABNORMAL LOW (ref 39.0–52.0)
Hemoglobin: 10 g/dL — ABNORMAL LOW (ref 13.0–17.0)
MCH: 27.2 pg (ref 26.0–34.0)
MCHC: 32.2 g/dL (ref 30.0–36.0)
MCV: 84.5 fL (ref 78.0–100.0)
PLATELETS: 455 10*3/uL — AB (ref 150–400)
RBC: 3.68 MIL/uL — AB (ref 4.22–5.81)
RDW: 14.9 % (ref 11.5–15.5)
WBC: 7.3 10*3/uL (ref 4.0–10.5)

## 2017-10-11 LAB — MAGNESIUM: Magnesium: 1.8 mg/dL (ref 1.7–2.4)

## 2017-10-11 NOTE — Progress Notes (Signed)
2 Days Post-Op  Subjective: Patient has no complaints.  Objective: Vital signs in last 24 hours: Temp:  [98.4 F (36.9 C)-98.8 F (37.1 C)] 98.5 F (36.9 C) (03/10 0607) Pulse Rate:  [73-82] 82 (03/10 0607) Resp:  [18] 18 (03/10 0607) BP: (116-132)/(77-89) 132/89 (03/10 0607) SpO2:  [98 %-100 %] 100 % (03/10 0607) Last BM Date: 10/11/17  Intake/Output from previous day: 03/09 0701 - 03/10 0700 In: 1742.5 [I.V.:1742.5] Out: 350 [Urine:250; Stool:100] Intake/Output this shift: Total I/O In: -  Out: 175 [Urine:175]  General appearance: alert, cooperative and no distress Resp: clear to auscultation bilaterally Cardio: regular rate and rhythm, S1, S2 normal, no murmur, click, rub or gallop GI: soft, colostomy patent.  Lab Results:  Recent Labs    10/10/17 0421 10/11/17 0648  WBC 5.8 7.3  HGB 10.0* 10.0*  HCT 31.6* 31.1*  PLT 469* 455*   BMET Recent Labs    10/10/17 0421 10/11/17 0648  NA 140 140  K 4.1 3.8  CL 105 106  CO2 25 27  GLUCOSE 95 89  BUN 8 8  CREATININE 0.83 0.74  CALCIUM 8.7* 8.5*   PT/INR No results for input(s): LABPROT, INR in the last 72 hours.  Studies/Results: No results found.  Anti-infectives: Anti-infectives (From admission, onward)   Start     Dose/Rate Route Frequency Ordered Stop   10/09/17 1200  levofloxacin (LEVAQUIN) tablet 250 mg     250 mg Oral Daily 10/09/17 1146     10/09/17 1000  ertapenem (INVANZ) 1 g in sodium chloride 0.9 % 100 mL IVPB     1 g 200 mL/hr over 30 Minutes Intravenous On call to O.R. 10/09/17 0756 10/09/17 1005      Assessment/Plan: s/p Procedure(s): DIVERTING LOOP COLOSTOMY (PALLATIVE) Imp:  Doing well.  Will encourage ambulation.  Stoma instruction started.  Voiding better.  Formal stoma instruction tomorrow.  LOS: 2 days    Aviva Signs 10/11/2017

## 2017-10-12 ENCOUNTER — Encounter (HOSPITAL_COMMUNITY): Payer: Self-pay | Admitting: General Surgery

## 2017-10-12 NOTE — Progress Notes (Signed)
3 Days Post-Op  Subjective: Patient still tenuous with ambulation.  He is having mild difficulty understanding care of his colostomy.  He is voiding better.  Objective: Vital signs in last 24 hours: Temp:  [98.5 F (36.9 C)-99.1 F (37.3 C)] 98.7 F (37.1 C) (03/11 1300) Pulse Rate:  [69-85] 69 (03/11 1300) Resp:  [20] 20 (03/11 1300) BP: (125-153)/(83-94) 125/84 (03/11 1300) SpO2:  [97 %-100 %] 98 % (03/11 1300) Last BM Date: 10/11/17  Intake/Output from previous day: 03/10 0701 - 03/11 0700 In: 240 [P.O.:240] Out: 1450 [Urine:1350; Stool:100] Intake/Output this shift: Total I/O In: 2012.5 [P.O.:480; I.V.:1532.5] Out: 1100 [Urine:1100]  General appearance: alert, cooperative and no distress Resp: clear to auscultation bilaterally Cardio: regular rate and rhythm, S1, S2 normal, no murmur, click, rub or gallop GI: Colostomy patent.  Abdomen soft.  Lab Results:  Recent Labs    10/10/17 0421 10/11/17 0648  WBC 5.8 7.3  HGB 10.0* 10.0*  HCT 31.6* 31.1*  PLT 469* 455*   BMET Recent Labs    10/10/17 0421 10/11/17 0648  NA 140 140  K 4.1 3.8  CL 105 106  CO2 25 27  GLUCOSE 95 89  BUN 8 8  CREATININE 0.83 0.74  CALCIUM 8.7* 8.5*   PT/INR No results for input(s): LABPROT, INR in the last 72 hours.  Studies/Results: No results found.  Anti-infectives: Anti-infectives (From admission, onward)   Start     Dose/Rate Route Frequency Ordered Stop   10/09/17 1200  levofloxacin (LEVAQUIN) tablet 250 mg     250 mg Oral Daily 10/09/17 1146     10/09/17 1000  ertapenem (INVANZ) 1 g in sodium chloride 0.9 % 100 mL IVPB     1 g 200 mL/hr over 30 Minutes Intravenous On call to O.R. 10/09/17 0756 10/09/17 1005      Assessment/Plan: s/p Procedure(s): DIVERTING LOOP COLOSTOMY (PALLATIVE) Impression: Postoperative day 3.  I told patient that he needed to continue ambulating.  Ostomy nurse will be back in the morning to reeducate patient.  Anticipate discharge  tomorrow.  LOS: 3 days    Aviva Signs 10/12/2017

## 2017-10-12 NOTE — Consult Note (Signed)
[place] Nurse ostomy consult note Stoma type/location: LLQ loop colostomy Stomal assessment/size: 2 1/4" with rigid plastic retention rod Peristomal assessment: intact  Retention rod  Stomal tissue is pale with sloughing stomal tissue noted.  Stoma is producing soft brown stool Treatment options for stomal/peristomal skin: barrier ring and 2 3/4" two piece pouch Output soft brown stool Ostomy pouching: 2pc.  2 3/4" pouch with barrier ring Education provided: Written materials provided.  Patient is distraught about the odor. Discussed a filtered pouch and that feature would improve the odor.  Stressed that the system should be airtight and odor noticed only when emptying or changing pouch and that he should investigate for leak of odor noted.  Will apply a filtered pouch this AM.  Old pouch removed (large 4 inch pouch). Replaced with barrier ring and filtered 2 3/4" pouch.  Explained that supplies at bedside do not have filter.  IF it has to be replaced, the nonfiltered is all that he has at bedside.  He understands.  Measured stoma.  Explained sloughing tissue, rationale for barrier ring.  Purpose of retention rod and peristomal cleansing are explained. New pouch is cut and applied. Patient able to demonstrate roll closure and understands to empty when 1/3 full.  Explained that he will empty when 1/3 full and change pouching system twice weekly and PRN leakage.  Explained that pouching will be easier once retention rod is removed.  Patient is appreciative.  Understands that he will have [place] for ongoing teaching.  Feels he could empty pouch at this time. Teaching ongoing while inpatient.  Enrolled patient in [place] program: Yes today [place] team will follow.  Domenic Moras RN BSN [place] Pager [phone]

## 2017-10-13 ENCOUNTER — Ambulatory Visit (HOSPITAL_COMMUNITY): Payer: Self-pay | Admitting: Internal Medicine

## 2017-10-13 ENCOUNTER — Encounter (HOSPITAL_COMMUNITY): Payer: Self-pay | Admitting: Dietician

## 2017-10-13 MED ORDER — OXYCODONE-ACETAMINOPHEN 7.5-325 MG PO TABS: 1.0000 | ORAL_TABLET | ORAL | 0 refills | Status: AC | PRN

## 2017-10-13 NOTE — Plan of Care (Signed)
Pt's understanding of how to change his ostomy bag is progressing. He stated that he feels more confident about caring for it at home.

## 2017-10-13 NOTE — Progress Notes (Signed)
Removed IV-clean, dry, intact. Reviewed d/c paperwork with patient and sister. Answered all questions. Walked pt around the circle of nurses station 2 times per request of pt. Pt stated that he felt good, but a little weak. Wheeled stable pt and belongings to main entrance where his sister picked him up.

## 2017-10-13 NOTE — Care Management Note (Signed)
Case Management Note  Patient Details  Name: Tanner Dudley MRN: [id number] Date of Birth: 03-Jun-1966  Expected Discharge Date:  10/13/17               Expected Discharge Plan:  [place]  In-House Referral:     Discharge planning Services  CM Consult  Post Acute Care Choice:  Home Health Choice offered to:  Patient  HH Arranged:  RN [facility] Agency:  [place]  Status of Service:  Completed, signed off  Additional Comments: Pt discharging home today. [facility] rep is aware of DC. Pt aware HH has 48 hrs to make first visit.  Sherald Barge, RN 10/13/2017, 9:41 AM

## 2017-10-13 NOTE — Discharge Instructions (Signed)
Colostomy, Adult, Care After Refer to this sheet in the next few weeks. These instructions provide you with information about caring for yourself after your procedure. Your health care provider may also give you more specific instructions. Your treatment has been planned according to current medical practices, but problems sometimes occur. Call your health care provider if you have any problems or questions after your procedure. What can I expect after the procedure? After the procedure, it is common to have:  Swelling at the opening that was created during the procedure (stoma).  Slight bleeding around the stoma.  Redness around the stoma.  Follow these instructions at home: Activity  Rest as needed while the stoma area heals.  Return to your normal activities as told by your health care provider. Ask your health care provider what activities are safe for you.  Avoid strenuous activity and abdominal exercises for 3 weeks or for as long as told by your health care provider.  Do not lift anything that is heavier than 10 lb (4.5 kg). Incision care   Follow instructions from your health care provider about how to take care of your incision. Make sure you: ? Wash your hands with soap and water before you change your bandage (dressing). If soap and water are not available, use hand sanitizer. ? Change your dressing as told by your health care provider. ? Leave stitches (sutures), skin glue, or adhesive strips in place. These skin closures may need to stay in place for 2 weeks or longer. If adhesive strip edges start to loosen and curl up, you may trim the loose edges. Do not remove adhesive strips completely unless your health care provider tells you to do that. Stoma Care  Keep the stoma area clean.  Clean and dry the skin around the stoma each time you change the colostomy bag. To clean the stoma area: ? Use warm water and only use cleansers that are recommended by your health care  provider. ? Rinse the stoma area with plain water. ? Dry the area well.  Use stoma powder or ointment on your skin only as told by your health care provider. Do not use any other powders, gels, wipes, or creams on your skin.  Check the stoma area every day for signs of infection. Check for: ? More redness, swelling, or pain. ? More fluid or blood. ? Pus or warmth.  Measure the stoma opening regularly and record the size. Watch for changes. Share this information with your health care provider. Bathing  Do not take baths, swim, or use a hot tub until your health care provider approves. Ask your health care provider if you can take showers. You may be able to shower with or without the colostomy bag in place. If you bathe with the bag on, dry the bag afterward.  Avoid using harsh or oily soaps when you bathe. Colostomy Bag Care  Follow instructions from your health care provider about how to empty or change the colostomy bag.  Keep colostomy supplies with you at all times.  Store all supplies in a cool, dry place.  Empty the colostomy bag: ? Whenever it is one-third to one-half full. ? At bedtime.  Replace the bag every 2-4 days or as told by your health care provider. Driving  Do not drive for 24 hours if you received a sedative.  Do not drive or operate heavy machinery while taking prescription pain medicine. General instructions  Follow instructions from your health care provider about eating  or drinking restrictions.  Take over-the-counter and prescription medicines only as told by your health care provider.  Avoid wearing clothes that are tight directly over your stoma.  Do not use any tobacco products, such as cigarettes, chewing tobacco, and e-cigarettes. If you need help quitting, ask your health care provider.  (Women) Ask your health care provider about becoming pregnant and about using birth control. Medicines may not be absorbed normally after the  procedure.  Keep all follow-up visits as told by your health care provider. This is important. Contact a health care provider if:  You are having trouble caring for your stoma or changing the colostomy bag.  You feel nauseous or you vomit.  You have a fever.  You havemore redness, swelling, or pain at the site of your stoma or around your anus.  You have more fluid or blood coming from your stoma or your anus.  Your stoma area feels warm to the touch.  You have pus coming from your stoma.  You notice a change in the size or appearance of the stoma.  You have abdominal pain, bloating, pressure, or cramping.  Your have stool more often or less often than your health care provider tells you to expect.  You are not making much urine. This may be a sign of dehydration. Get help right away if:  Your abdominal pain does not go away or it becomes severe.  You keep vomiting.  Your stool is not draining through the stoma.  You have chest pain or an irregular heartbeat. This information is not intended to replace advice given to you by your health care provider. Make sure you discuss any questions you have with your health care provider. Document Released: 12/11/2010 Document Revised: 11/29/2015 Document Reviewed: 04/03/2015 Elsevier Interactive Patient Education  2018 [employer].

## 2017-10-13 NOTE — Discharge Summary (Signed)
Physician Discharge Summary  Patient ID: Tanner Dudley MRN: [id number] DOB/AGE: [birth year] 52 y.o.  Admit date: 10/09/2017 Discharge date: 10/13/2017  Admission Diagnoses: Rectal cancer, near obstructing  Discharge Diagnoses: Same Active Problems:   Rectal cancer ([place])   S/P colostomy [facility])   Discharged Condition: good  Hospital Course: Patient is a 52 year old black male who was referred to my care for a diverting loop colostomy as he is about to undergo chemotherapy and radiation therapy for a near obstructing rectal carcinoma.  He underwent a transverse loop colostomy on 10/09/2017.  Tolerated procedure well.  His postoperative course was unremarkable.  His diet was advanced without difficulty.  He was given instructions concerning care for his colostomy.  He is being discharged home on 10/13/2017 in good and improving condition.  Treatments: surgery: Diverting loop colostomy on 10/09/2017  Discharge Exam: Blood pressure (!) 141/89, pulse 86, temperature 98.7 F (37.1 C), temperature source Oral, resp. rate 18, height 5\' 8"  (1.727 m), weight 148 lb 9.4 oz (67.4 kg), SpO2 99 %. General appearance: alert, cooperative and no distress Resp: clear to auscultation bilaterally Cardio: regular rate and rhythm, S1, S2 normal, no murmur, click, rub or gallop GI: Soft, flat.  Colostomy pink and patent.  Disposition: 01-Home or Self Care  Discharge Instructions    Diet - low sodium heart healthy   Complete by:  As directed    Increase activity slowly   Complete by:  As directed      Allergies as of 10/13/2017   No Known Allergies     Medication List    TAKE these medications   capecitabine 500 MG tablet Commonly known as:  XELODA 3 tablets po bid Monday through Friday with RT   escitalopram 10 MG tablet Commonly known as:  LEXAPRO Take 10 mg by mouth daily.   levofloxacin 250 MG tablet Commonly known as:  LEVAQUIN Take 1 tablet by mouth daily.   oxyCODONE-acetaminophen 7.5-325  MG tablet Commonly known as:  PERCOCET Take 1 tablet by mouth every 4 (four) hours as needed for severe pain.      Follow-up Information    [facility] Follow up.   Why:  home health RN Contact information: [address] [place]       Aviva Signs, MD Follow up.   Specialty:  General Surgery Why:  as needed Contact information: [address] [place] [address] [phone]           Signed: Aviva Signs 10/13/2017, 8:40 AM

## 2017-10-13 NOTE — Consult Note (Signed)
[place] Nurse ostomy follow up Stoma type/location: LLQ loop colostomy Stomal assessment/size: 2 1/4" with retention rod Peristomal assessment: not assessed Treatment options for stomal/peristomal skin: barrier ring Output soft brown stool Ostomy pouching: 2pc. 2 3/4" pouch with barrier ring Education provided: Patient walked to bathroom and emptied pouch with me.  He is able to use toilet paper wicks to cleanse pouch after emptying.  Able to open and roll closed.  Understands to do this when 1//3 full.  Discussed showering, pouch changes and active lifestyle with ostomy. He is optimistic and feels ready for discharge. He will be going to his sister's home in [place].  Supplies at bedside to take home with him Enrolled patient in [facility] Discharge program: Yes Following through discharge.   Domenic Moras RN BSN [place] Pager [phone]

## 2017-10-14 ENCOUNTER — Ambulatory Visit (HOSPITAL_COMMUNITY)
Admission: RE | Admit: 2017-10-14 | Discharge: 2017-10-14 | Disposition: A | Discharge status: Home / Self Care | Payer: Medicaid Other | Source: Ambulatory Visit | Attending: Urology | Admitting: Urology

## 2017-10-14 DIAGNOSIS — K6289 Other specified diseases of anus and rectum: Secondary | ICD-10-CM

## 2017-10-14 MED ORDER — GADOBENATE DIMEGLUMINE 529 MG/ML IV SOLN
13.0000 mL | Freq: Once | INTRAVENOUS | Status: AC | PRN
  Administered 2017-10-14: 13 mL via INTRAVENOUS

## 2017-10-24 ENCOUNTER — Telehealth: Payer: Self-pay | Admitting: General Surgery

## 2017-10-24 NOTE — Telephone Encounter (Signed)
[facility]  Rod fell out. Making stool from the colostomy. No blood. Eating ok. No fevers.   Was to see Dr. Arnoldo Morale PRN.  Curlene Labrum, MD [facility] [address] [place], [place] [address] [phone] (office)

## 2017-11-16 ENCOUNTER — Other Ambulatory Visit: Payer: Self-pay

## 2017-11-16 ENCOUNTER — Encounter: Payer: Self-pay | Admitting: Genetic Counselor

## 2017-12-04 ENCOUNTER — Encounter (HOSPITAL_COMMUNITY): Payer: Self-pay | Admitting: Internal Medicine

## 2018-02-24 ENCOUNTER — Other Ambulatory Visit (HOSPITAL_COMMUNITY): Payer: Self-pay | Admitting: Oncology

## 2018-02-24 DIAGNOSIS — C20 Malignant neoplasm of rectum: Secondary | ICD-10-CM

## 2018-03-10 ENCOUNTER — Ambulatory Visit (HOSPITAL_COMMUNITY): Payer: Medicaid Other

## 2018-05-03 ENCOUNTER — Other Ambulatory Visit (HOSPITAL_COMMUNITY): Payer: Self-pay | Admitting: Oncology

## 2018-05-03 DIAGNOSIS — C20 Malignant neoplasm of rectum: Secondary | ICD-10-CM

## 2018-05-07 ENCOUNTER — Telehealth: Payer: Self-pay | Admitting: *Deleted

## 2018-05-11 ENCOUNTER — Ambulatory Visit (HOSPITAL_COMMUNITY): Payer: Medicaid Other

## 2018-05-11 ENCOUNTER — Encounter (HOSPITAL_COMMUNITY): Payer: Self-pay

## 2018-12-10 ENCOUNTER — Ambulatory Visit (HOSPITAL_COMMUNITY): Admission: RE | Admit: 2018-12-10 | Payer: Medicaid Other | Source: Ambulatory Visit

## 2018-12-10 ENCOUNTER — Other Ambulatory Visit (HOSPITAL_COMMUNITY): Payer: Self-pay | Admitting: Family Medicine

## 2018-12-10 ENCOUNTER — Other Ambulatory Visit: Payer: Self-pay | Admitting: Family Medicine

## 2018-12-10 DIAGNOSIS — L02416 Cutaneous abscess of left lower limb: Secondary | ICD-10-CM

## 2018-12-10 DIAGNOSIS — C20 Malignant neoplasm of rectum: Secondary | ICD-10-CM

## 2018-12-10 DIAGNOSIS — R600 Localized edema: Secondary | ICD-10-CM

## 2018-12-10 DIAGNOSIS — L03116 Cellulitis of left lower limb: Secondary | ICD-10-CM

## 2018-12-14 ENCOUNTER — Other Ambulatory Visit: Payer: Self-pay

## 2018-12-14 ENCOUNTER — Ambulatory Visit (HOSPITAL_COMMUNITY)
Admission: RE | Admit: 2018-12-14 | Discharge: 2018-12-14 | Disposition: A | Discharge status: Home / Self Care | Payer: Medicaid Other | Source: Ambulatory Visit | Attending: Family Medicine | Admitting: Family Medicine

## 2018-12-14 DIAGNOSIS — L02416 Cutaneous abscess of left lower limb: Secondary | ICD-10-CM | POA: Diagnosis present

## 2018-12-14 DIAGNOSIS — R600 Localized edema: Secondary | ICD-10-CM | POA: Insufficient documentation

## 2018-12-14 DIAGNOSIS — C20 Malignant neoplasm of rectum: Secondary | ICD-10-CM | POA: Diagnosis present

## 2018-12-14 DIAGNOSIS — L03116 Cellulitis of left lower limb: Secondary | ICD-10-CM | POA: Insufficient documentation

## 2018-12-17 IMAGING — CT NM PET TUM IMG INITIAL (PI) SKULL BASE T - THIGH
8 series · 23 of 25 positions shown · non-contrast
Comparison: CT 09/14/2017

CLINICAL DATA: Initial treatment strategy for rectal carcinoma 7.8.

EXAM:
NUCLEAR MEDICINE PET SKULL BASE TO THIGH
TECHNIQUE: 7.8 mCi F-18 FDG was injected intravenously. Full-ring PET imaging
was performed from the skull base to thigh after the radiotracer. CT
data was obtained and used for attenuation correction and anatomic
localization.
FASTING BLOOD GLUCOSE:  Value: 69 mg/dl
Mediastinal blood pool activity: SUV max equal

[Series 3: pet sk_thigh ac · axial · 5.0mm · 4.07mm/px · z∈[-960,-40]mm · 5 of 231 slices shown]
[im 1/231]
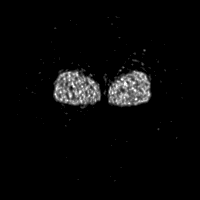
[im 58/231]
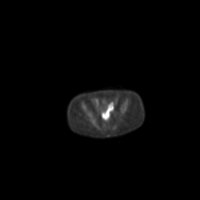
[im 116/231]
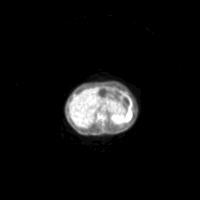
[im 173/231]
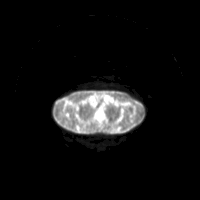
[im 231/231]
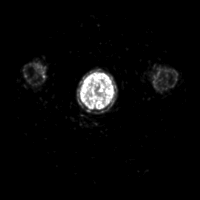

[Series 4: ct sk_thigh 5.0 b31f · axial · 5.0mm · 0.98mm/px · z∈[-960,-40]mm · 4 of 231 slices shown]
[im 1/231]
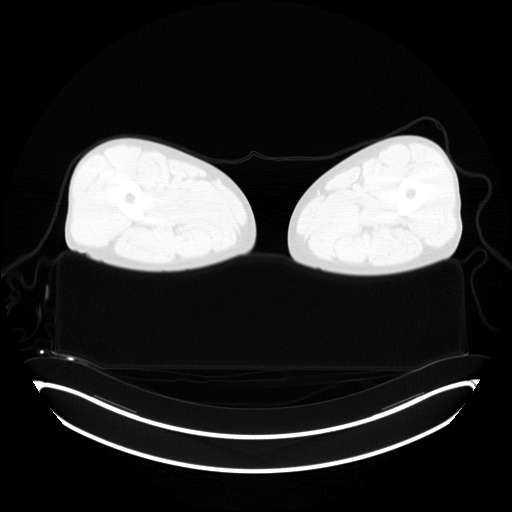
[im 116/231]
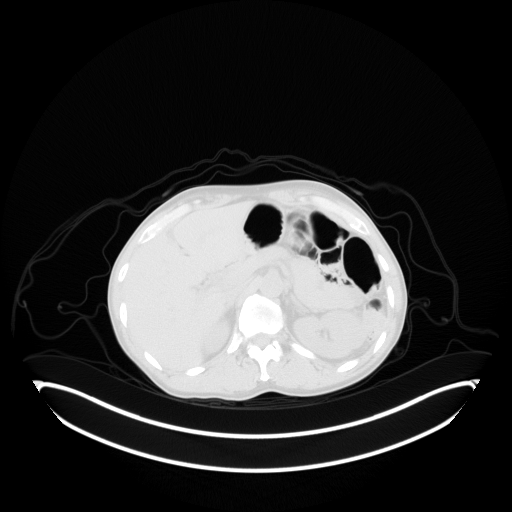
[im 173/231]
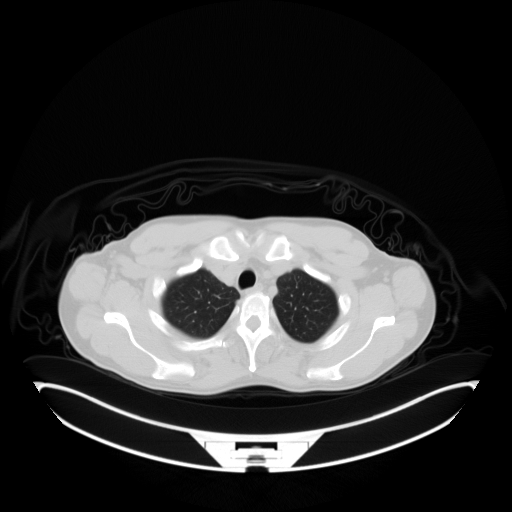
[im 231/231  brain]
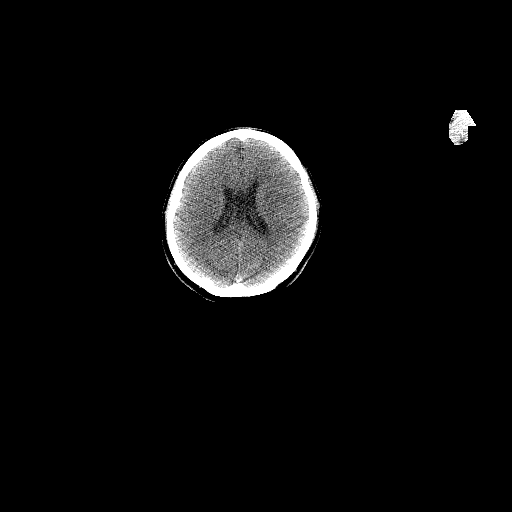

[Series 5: pet sk_thigh nac · axial · 5.0mm · 4.07mm/px · z∈[-960,-40]mm · 5 of 231 slices shown]
[im 1/231]
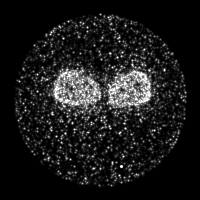
[im 58/231]
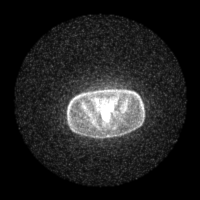
[im 116/231]
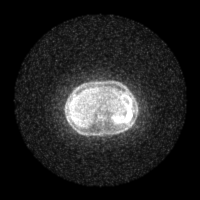
[im 173/231]
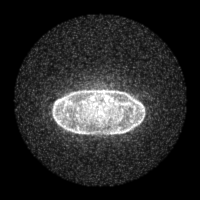
[im 231/231]
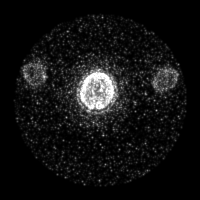

[Series 8: ct sk_thigh 5.0 b70f lung_bone, · axial · 5.0mm · 0.61mm/px · 1 of 64 slices shown]
[im 1/64  bone]
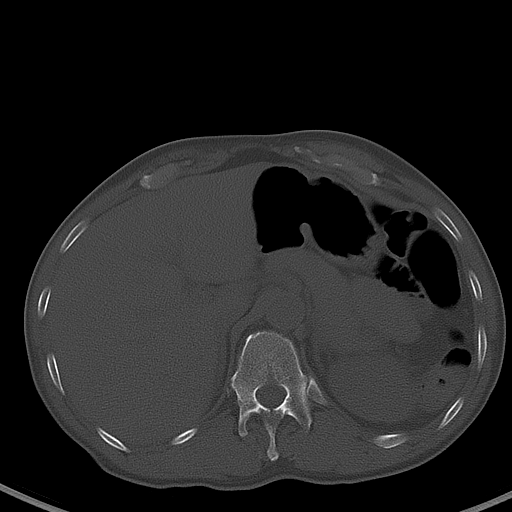

[Series 603: mip range 3 · coronal · 1.91mm/px · 1 of 32 slices shown]
[im 1/32]
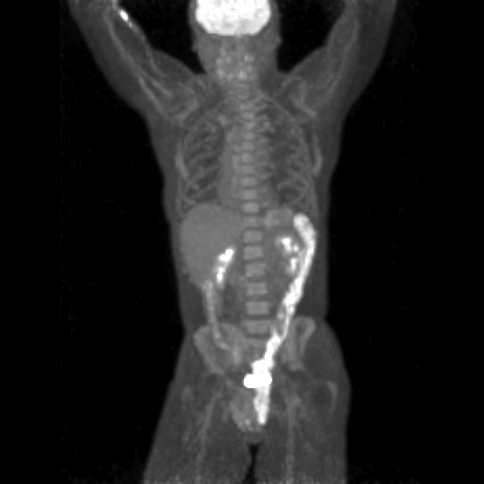

[Series 604: range-ac ct sk_thigh 5.0 hd_fov-cor-<alpha range> · 1 of 82 slices shown]
[im 1/82]
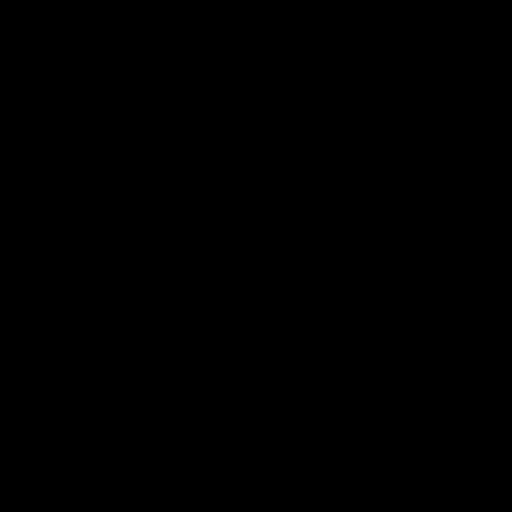

[Series 605: range-ac ct sk_thigh 5.0 hd_fov-tra-<alpha range> · 5 of 217 slices shown]
[im 1/217]
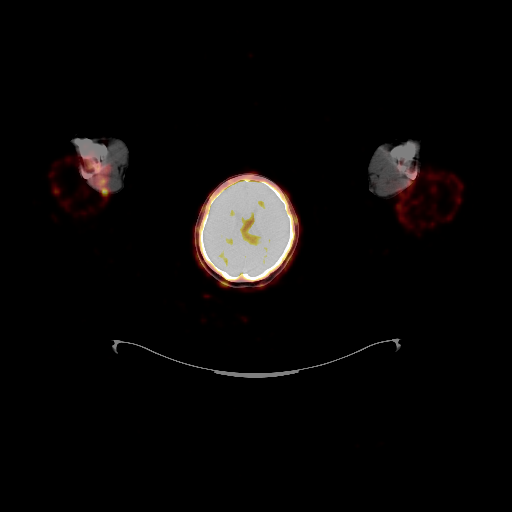
[im 55/217]
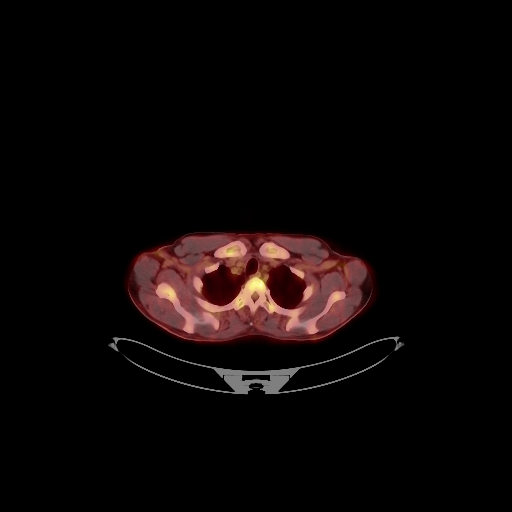
[im 109/217]
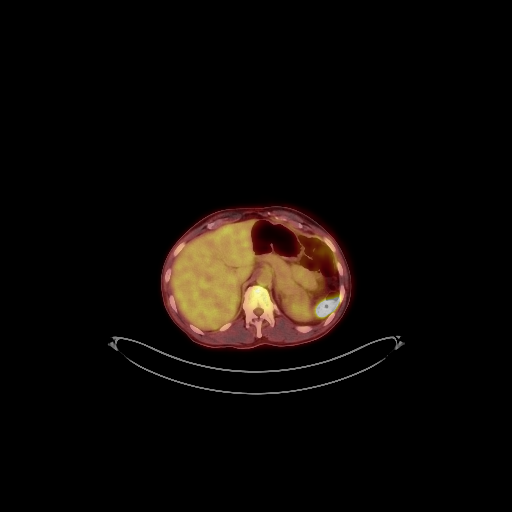
[im 163/217]
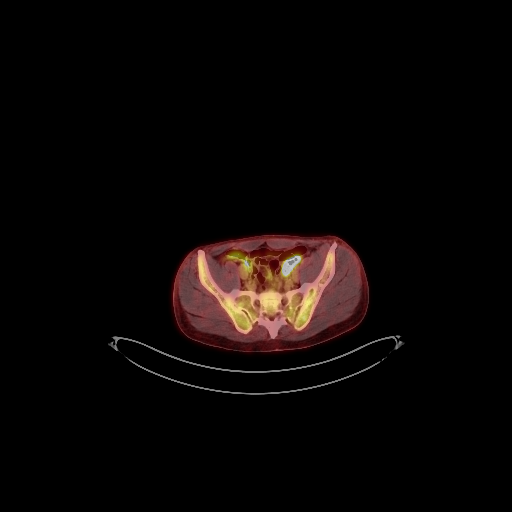
[im 217/217]
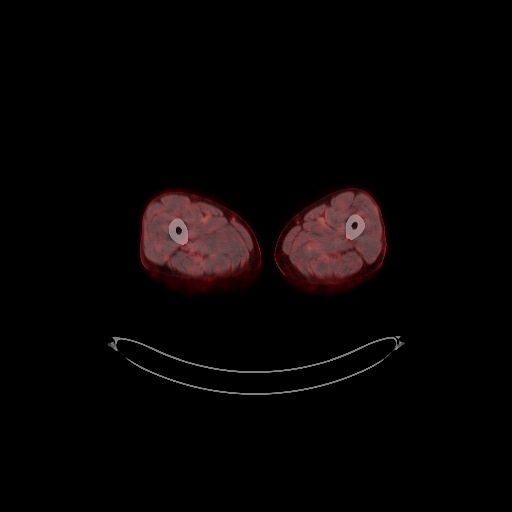

[Series 1080: results mm oncology reading · 1.0mm · 0.45mm/px · 1 of 2 slices shown]
[im 1/2]
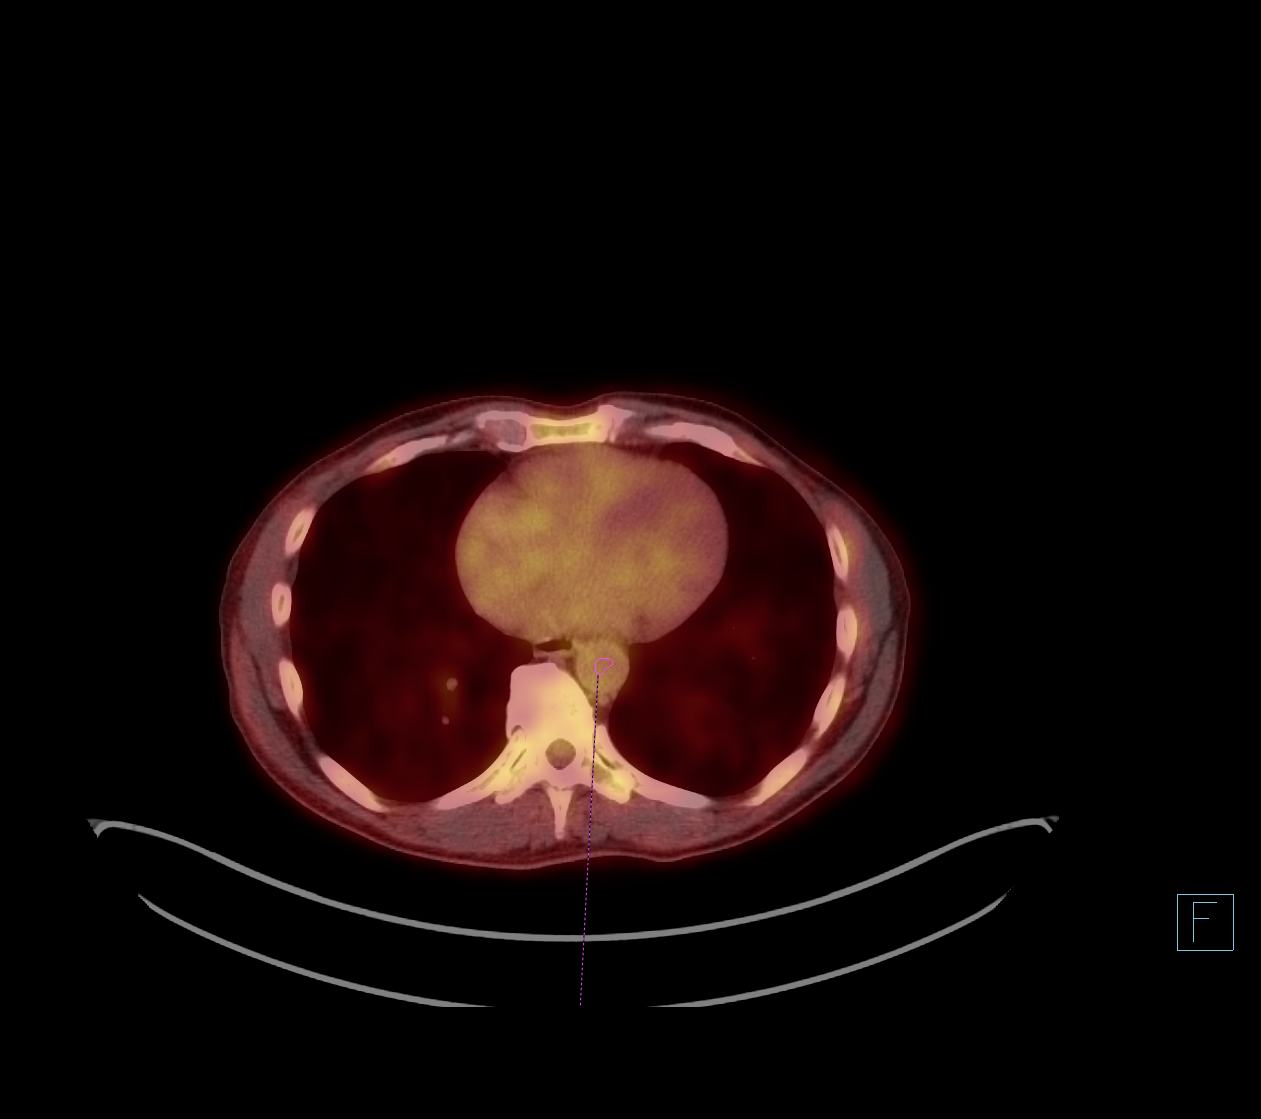

[23 of 25 positions shown; findings below may reference images not displayed]

FINDINGS: NECK: No hypermetabolic lymph nodes in the neck.

Incidental CT findings: none

CHEST: No suspicious pulmonary nodule. No hypermetabolic mediastinal
lymph nodes.

Incidental CT findings: none

ABDOMEN/PELVIS: There is intense activity at the anus and rectum;
however no more intense than the diffuse intense activity throughout
the sigmoid colon and descending colon. Hypermetabolic colon
activity extends to the splenic flexure

No hypermetabolic or enlarged pelvic lymph nodes. No hypermetabolic
mesenteric periaortic lymph.

No

Hypermetabolic activity liver.

Incidental CT findings: Pancreas, kidneys and prostate normal

SKELETON: There is diffuse increase in marrow activity and spine and
pelvis. No CT lesion. No focal lesion.
IMPRESSION: 1. Intense metabolic activity from the anus to the splenic flexure
of the descending colon. As this segment of colon was normal on
recent colonoscopy findings are likely related to inflammation from
colonoscopy. Cannot differentiate the anal carcinoma metabolic
activity from the presumed inflammatory activity in the bowel.
2. No evidence of metastatic adenopathy in the abdomen pelvis.
3. No evidence of liver metastasis.
4. No evidence of thoracic metastasis.
5. Diffuse increase in marrow activity is favored represent marrow
hyperplasia related to anemia.

## 2019-03-14 DIAGNOSIS — F17200 Nicotine dependence, unspecified, uncomplicated: Secondary | ICD-10-CM | POA: Insufficient documentation

## 2020-06-22 ENCOUNTER — Other Ambulatory Visit (HOSPITAL_BASED_OUTPATIENT_CLINIC_OR_DEPARTMENT_OTHER): Payer: Self-pay

## 2020-06-22 DIAGNOSIS — R0683 Snoring: Secondary | ICD-10-CM

## 2020-06-22 DIAGNOSIS — R0681 Apnea, not elsewhere classified: Secondary | ICD-10-CM

## 2020-06-22 DIAGNOSIS — G4710 Hypersomnia, unspecified: Secondary | ICD-10-CM

## 2020-07-09 ENCOUNTER — Other Ambulatory Visit (HOSPITAL_COMMUNITY): Payer: Self-pay | Admitting: Radiology

## 2020-07-09 DIAGNOSIS — D751 Secondary polycythemia: Secondary | ICD-10-CM

## 2020-07-23 ENCOUNTER — Other Ambulatory Visit: Payer: Self-pay

## 2020-07-23 ENCOUNTER — Other Ambulatory Visit (HOSPITAL_COMMUNITY)
Admission: RE | Admit: 2020-07-23 | Discharge: 2020-07-23 | Disposition: A | Discharge status: Home / Self Care | Payer: Medicaid Other | Source: Ambulatory Visit | Attending: Oncology | Admitting: Oncology

## 2020-07-23 DIAGNOSIS — Z20822 Contact with and (suspected) exposure to covid-19: Secondary | ICD-10-CM | POA: Diagnosis not present

## 2020-07-23 DIAGNOSIS — Z01812 Encounter for preprocedural laboratory examination: Secondary | ICD-10-CM | POA: Insufficient documentation

## 2020-07-24 LAB — SARS CORONAVIRUS 2 (TAT 6-24 HRS): SARS Coronavirus 2: NEGATIVE

## 2020-07-26 ENCOUNTER — Ambulatory Visit (HOSPITAL_COMMUNITY)
Admission: RE | Admit: 2020-07-26 | Discharge: 2020-07-26 | Disposition: A | Discharge status: Home / Self Care | Payer: Medicaid Other | Source: Ambulatory Visit | Attending: Oncology | Admitting: Oncology

## 2020-07-26 ENCOUNTER — Other Ambulatory Visit: Payer: Self-pay

## 2020-07-26 DIAGNOSIS — D751 Secondary polycythemia: Secondary | ICD-10-CM | POA: Diagnosis present

## 2020-07-26 LAB — PULMONARY FUNCTION TEST
DL/VA % pred: 111 %
DL/VA: 4.96 ml/min/mmHg/L
DLCO unc % pred: 79 %
DLCO unc: 21.06 ml/min/mmHg
FEF 25-75 Post: 3.07 L/sec
FEF 25-75 Pre: 3.37 L/sec
FEF2575-%Change-Post: -9 %
FEF2575-%Pred-Post: 103 %
FEF2575-%Pred-Pre: 113 %
FEV1-%Change-Post: -3 %
FEV1-%Pred-Post: 86 %
FEV1-%Pred-Pre: 90 %
FEV1-Post: 2.57 L
FEV1-Pre: 2.67 L
FEV1FVC-%Change-Post: 3 %
FEV1FVC-%Pred-Pre: 107 %
FEV6-%Change-Post: -7 %
FEV6-%Pred-Post: 80 %
FEV6-%Pred-Pre: 86 %
FEV6-Post: 2.89 L
FEV6-Pre: 3.12 L
FEV6FVC-%Pred-Post: 103 %
FEV6FVC-%Pred-Pre: 103 %
FVC-%Change-Post: -7 %
FVC-%Pred-Post: 77 %
FVC-%Pred-Pre: 83 %
FVC-Post: 2.89 L
FVC-Pre: 3.12 L
Post FEV1/FVC ratio: 89 %
Post FEV6/FVC ratio: 100 %
Pre FEV1/FVC ratio: 86 %
Pre FEV6/FVC Ratio: 100 %
RV % pred: 114 %
RV: 2.19 L
TLC % pred: 82 %
TLC: 5.26 L

## 2020-07-26 MED ORDER — ALBUTEROL SULFATE (2.5 MG/3ML) 0.083% IN NEBU
2.5000 mg | INHALATION_SOLUTION | Freq: Once | RESPIRATORY_TRACT | Status: AC
Start: 2020-07-26 — End: 2020-07-26
  Administered 2020-07-26: 15:00:00 2.5 mg via RESPIRATORY_TRACT

## 2020-07-30 ENCOUNTER — Other Ambulatory Visit: Payer: Self-pay

## 2020-07-30 ENCOUNTER — Ambulatory Visit (HOSPITAL_BASED_OUTPATIENT_CLINIC_OR_DEPARTMENT_OTHER): Payer: Medicaid Other | Attending: Internal Medicine | Admitting: Internal Medicine

## 2020-07-30 DIAGNOSIS — R0683 Snoring: Secondary | ICD-10-CM

## 2020-07-30 DIAGNOSIS — G4733 Obstructive sleep apnea (adult) (pediatric): Secondary | ICD-10-CM | POA: Diagnosis not present

## 2020-07-30 DIAGNOSIS — G4710 Hypersomnia, unspecified: Secondary | ICD-10-CM | POA: Insufficient documentation

## 2020-07-30 DIAGNOSIS — R0681 Apnea, not elsewhere classified: Secondary | ICD-10-CM | POA: Insufficient documentation

## 2020-07-31 NOTE — Procedures (Signed)
   NAME: Tanner Dudley DATE OF BIRTH:  12/04/1965 MEDICAL RECORD NUMBER [id number]  LOCATION: [facility]  PHYSICIAN: Deretha Emory  DATE OF STUDY: 07/30/2020  SLEEP STUDY TYPE: Nocturnal Polysomnogram               REFERRING PHYSICIAN: Deretha Emory, MD  EPWORTH SLEEPINESS SCORE:  7 HEIGHT: 5\' 8"  (172.7 cm)  WEIGHT: 210 lb (95.3 kg)    Body mass index is 31.93 kg/m.  NECK SIZE: 16 in.  CLINICAL INFORMATION The patient was referred to the sleep center for evaluation of Excessive Daytime Sleepiness, Snoring, Witnessed Apneas, Awakening Gasping for Breath  MEDICATIONS Patient self administered medications include: NIFEDIPINE. No sleep medicine administered.  SLEEP STUDY TECHNIQUE A multi-channel overnight Polysomnography study was performed. The channels recorded and monitored were central and occipital EEG, electrooculogram (EOG), submentalis EMG (chin), nasal and oral airflow, thoracic and abdominal wall motion, anterior tibialis EMG, snore microphone, electrocardiogram, and a pulse oximetry.  TECHNICAL COMMENTS Comments added by Technician: NONE Comments added by Scorer: N/A  SLEEP ARCHITECTURE The study was initiated at 10:25:25 PM and terminated at 4:40:10 AM. The total recorded time was 374.7 minutes. EEG confirmed total sleep time was 269.5 minutes yielding a sleep efficiency of 71.9%%. Sleep onset after lights out was 17.4 minutes with a REM latency of 217.0 minutes. The patient spent 13.5%% of the night in stage N1 sleep, 72.4%% in stage N2 sleep, 0.0%% in stage N3 and 14.1% in REM. Wake after sleep onset (WASO) was 87.9 minutes. The Arousal Index was 24.7/hour.  RESPIRATORY PARAMETERS There were a total of 16 respiratory disturbances out of which 0 were apneas ( 0 obstructive, 0 mixed, 0 central) and 16 hypopneas. The apnea/hypopnea index (AHI) was 3.6 events/hour. The central sleep apnea index was 0.0 events/hour. The REM AHI was 12.6 events/hour  and NREM AHI was 2.1 events/hour. The supine AHI was 0.0 events/hour and the non supine AHI was 3.6 events/hour.  The Respiratory Disturbance Index was 17.8 events/hour overall and 15.8 events/hour in REM sleep. Respiratory disturbances were associated with oxygen desaturation down to a nadir of 87.0% during sleep. The mean oxygen saturation during the study was 91.8%. The cumulative time under 88% oxygen saturation was 2.6 minutes.  LEG MOVEMENT DATA The total leg movements were 16 with a resulting leg movement index of 3.6/hr . Associated arousal with leg movement index was 0.2/hr.  CARDIAC DATA The underlying cardiac rhythm was most consistent with sinus rhythm. Mean heart rate during sleep was 78.8 bpm. Additional rhythm abnormalities include PVCs.  IMPRESSIONS - No Significant Obstructive Sleep Apnea (OSA) by AHI. Mild Obstructive Sleep Apnea by RDI. In the past, this would have been called Upper Airway Resistance Syndrome (UARS). - Few periodic leg movements(PLMs) during sleep. However, no significant associated arousals.  DIAGNOSIS - Obstructive Sleep Apnea  RECOMMENDATIONS - Insurance will usually not cover CPAP for this condition. Weight loss might reduce the degree of sleep disordered breathing that is seen in this study.    Marland Kitchen Sleep specialist, American Board of Internal Medicine  ELECTRONICALLY SIGNED ON:  07/31/2020, 8:49 PM [facility] PH: [phone]   FX: [phone] ACCREDITED BY THE AMERICAN ACADEMY OF SLEEP MEDICINE

## 2021-03-18 ENCOUNTER — Encounter: Payer: Self-pay | Admitting: Internal Medicine

## 2021-03-19 ENCOUNTER — Encounter: Payer: Self-pay | Admitting: Internal Medicine

## 2021-07-17 ENCOUNTER — Ambulatory Visit: Payer: Medicaid Other | Admitting: Gastroenterology

## 2021-07-17 ENCOUNTER — Encounter: Payer: Self-pay | Admitting: Gastroenterology

## 2021-08-02 ENCOUNTER — Other Ambulatory Visit (HOSPITAL_COMMUNITY): Payer: Self-pay | Admitting: Nurse Practitioner

## 2021-08-02 DIAGNOSIS — C20 Malignant neoplasm of rectum: Secondary | ICD-10-CM

## 2021-08-15 ENCOUNTER — Other Ambulatory Visit: Payer: Self-pay

## 2021-08-15 ENCOUNTER — Encounter (HOSPITAL_COMMUNITY)
Admission: RE | Admit: 2021-08-15 | Discharge: 2021-08-15 | Disposition: A | Discharge status: Home / Self Care | Payer: Medicaid Other | Source: Ambulatory Visit | Attending: Nurse Practitioner | Admitting: Nurse Practitioner

## 2021-08-15 DIAGNOSIS — C20 Malignant neoplasm of rectum: Secondary | ICD-10-CM | POA: Insufficient documentation

## 2021-08-15 MED ORDER — FLUDEOXYGLUCOSE F - 18 (FDG) INJECTION
11.2100 | Freq: Once | INTRAVENOUS | Status: AC | PRN
  Administered 2021-08-15: 11.21 via INTRAVENOUS

## 2023-04-02 DIAGNOSIS — I10 Essential (primary) hypertension: Secondary | ICD-10-CM | POA: Diagnosis not present

## 2023-04-02 DIAGNOSIS — Z720 Tobacco use: Secondary | ICD-10-CM | POA: Diagnosis not present

## 2023-04-02 DIAGNOSIS — J020 Streptococcal pharyngitis: Secondary | ICD-10-CM | POA: Diagnosis not present

## 2023-04-02 DIAGNOSIS — Z1152 Encounter for screening for COVID-19: Secondary | ICD-10-CM | POA: Diagnosis not present

## 2023-04-02 DIAGNOSIS — R0981 Nasal congestion: Secondary | ICD-10-CM | POA: Diagnosis not present

## 2023-04-02 DIAGNOSIS — Z85048 Personal history of other malignant neoplasm of rectum, rectosigmoid junction, and anus: Secondary | ICD-10-CM | POA: Diagnosis not present

## 2023-04-02 DIAGNOSIS — M791 Myalgia, unspecified site: Secondary | ICD-10-CM | POA: Diagnosis not present

## 2023-04-27 DIAGNOSIS — U071 COVID-19: Secondary | ICD-10-CM | POA: Diagnosis not present

## 2023-04-27 DIAGNOSIS — R0981 Nasal congestion: Secondary | ICD-10-CM | POA: Diagnosis not present

## 2023-04-27 DIAGNOSIS — R051 Acute cough: Secondary | ICD-10-CM | POA: Diagnosis not present

## 2023-06-03 ENCOUNTER — Inpatient Hospital Stay: Payer: 59

## 2023-06-03 ENCOUNTER — Inpatient Hospital Stay: Payer: 59 | Attending: Hematology | Admitting: Hematology

## 2023-06-03 ENCOUNTER — Encounter: Payer: Self-pay | Admitting: Hematology

## 2023-06-03 VITALS — BP 134/104 | HR 107 | Temp 98.3°F | Resp 16 | Ht 67.0 in | Wt 203.4 lb

## 2023-06-03 DIAGNOSIS — C20 Malignant neoplasm of rectum: Secondary | ICD-10-CM | POA: Diagnosis not present

## 2023-06-03 DIAGNOSIS — F17210 Nicotine dependence, cigarettes, uncomplicated: Secondary | ICD-10-CM | POA: Insufficient documentation

## 2023-06-03 DIAGNOSIS — Z933 Colostomy status: Secondary | ICD-10-CM | POA: Insufficient documentation

## 2023-06-03 DIAGNOSIS — Z79899 Other long term (current) drug therapy: Secondary | ICD-10-CM | POA: Diagnosis not present

## 2023-06-03 DIAGNOSIS — I10 Essential (primary) hypertension: Secondary | ICD-10-CM | POA: Insufficient documentation

## 2023-06-03 LAB — COMPREHENSIVE METABOLIC PANEL
ALT: 59 U/L — ABNORMAL HIGH (ref 0–44)
AST: 40 U/L (ref 15–41)
Albumin: 4.4 g/dL (ref 3.5–5.0)
Alkaline Phosphatase: 53 U/L (ref 38–126)
Anion gap: 12 (ref 5–15)
BUN: 9 mg/dL (ref 6–20)
CO2: 24 mmol/L (ref 22–32)
Calcium: 9.4 mg/dL (ref 8.9–10.3)
Chloride: 101 mmol/L (ref 98–111)
Creatinine, Ser: 1.17 mg/dL (ref 0.61–1.24)
GFR, Estimated: >60 mL/min (ref >60)
Glucose, Bld: 97 mg/dL (ref 70–99)
Potassium: 4 mmol/L (ref 3.5–5.1)
Sodium: 137 mmol/L (ref 135–145)
Total Bilirubin: 1.3 mg/dL — ABNORMAL HIGH (ref 0.3–1.2)
Total Protein: 8.1 g/dL (ref 6.5–8.1)

## 2023-06-03 LAB — CBC WITH DIFFERENTIAL/PLATELET
Abs Immature Granulocytes: 0.01 10*3/uL (ref 0.00–0.07)
Basophils Absolute: 0.1 10*3/uL (ref 0.0–0.1)
Basophils Relative: 2 %
Eosinophils Absolute: 0.1 10*3/uL (ref 0.0–0.5)
Eosinophils Relative: 2 %
HCT: 56.9 % — ABNORMAL HIGH (ref 39.0–52.0)
Hemoglobin: 19.3 g/dL — ABNORMAL HIGH (ref 13.0–17.0)
Immature Granulocytes: 0 %
Lymphocytes Relative: 32 %
Lymphs Abs: 1.5 10*3/uL (ref 0.7–4.0)
MCH: 31 pg (ref 26.0–34.0)
MCHC: 33.9 g/dL (ref 30.0–36.0)
MCV: 91.5 fL (ref 80.0–100.0)
Monocytes Absolute: 0.5 10*3/uL (ref 0.1–1.0)
Monocytes Relative: 11 %
Neutro Abs: 2.4 10*3/uL (ref 1.7–7.7)
Neutrophils Relative %: 53 %
Platelets: 278 10*3/uL (ref 150–400)
RBC: 6.22 MIL/uL — ABNORMAL HIGH (ref 4.22–5.81)
RDW: 13.4 % (ref 11.5–15.5)
WBC: 4.5 10*3/uL (ref 4.0–10.5)
nRBC: 0 % (ref 0.0–0.2)

## 2023-06-03 MED ORDER — AMLODIPINE BESYLATE 5 MG PO TABS: 5.0000 mg | ORAL_TABLET | Freq: Every day | ORAL | 1 refills | Status: DC

## 2023-06-03 MED ORDER — ESCITALOPRAM OXALATE 20 MG PO TABS: 20.0000 mg | ORAL_TABLET | Freq: Every day | ORAL | 1 refills | Status: DC

## 2023-06-03 NOTE — Patient Instructions (Signed)
[facility]  Discharge Instructions  You were seen and examined today by Dr. Ellin Saba. Dr. Ellin Saba is a medical oncologist, meaning that he specializes in the treatment of cancer diagnoses. Dr. Ellin Saba discussed your past medical history, family history of cancers, and the events that led to you being here today.  You were referred to Dr. Ellin Saba for ongoing management of your colon cancer.  Dr. Ellin Saba has ordered labs today.  Dr. Ellin Saba will also arrange for a CT scan since you are past due.  [facility] is accepting new patients - please contact them to set up a new patient appointment.  Follow-up as scheduled.  Thank you for choosing  [facility] to provide your oncology and hematology care.   To afford each patient quality time with our provider, please arrive at least 15 minutes before your scheduled appointment time. You may need to reschedule your appointment if you arrive late (10 or more minutes). Arriving late affects you and other patients whose appointments are after yours.  Also, if you miss three or more appointments without notifying the office, you may be dismissed from the clinic at the provider's discretion.    Again, thank you for choosing [facility].  Our hope is that these requests will decrease the amount of time that you wait before being seen by our physicians.   If you have a lab appointment with the Cancer Center - please note that after April 8th, all labs will be drawn in the cancer center.  You do not have to check in or register with the main entrance as you have in the past but will complete your check-in at the cancer center.            _____________________________________________________________  Should you have questions after your visit to [facility], please contact our office at [phone] and follow the prompts.  Our office hours are 8:00  a.m. to 4:30 p.m. Monday - Thursday and 8:00 a.m. to 2:30 p.m. Friday.  Please note that voicemails left after 4:00 p.m. may not be returned until the following business day.  We are closed weekends and all major holidays.  You do have access to a nurse 24-7, just call the main number to the clinic [phone] and do not press any options, hold on the line and a nurse will answer the phone.    For prescription refill requests, have your pharmacy contact our office and allow 72 hours.    Masks are no longer required in the cancer centers. If you would like for your care team to wear a mask while they are taking care of you, please let them know. You may have one support person who is at least 55 years old accompany you for your appointments.

## 2023-06-03 NOTE — Progress Notes (Signed)
[facility] [address] [place] [address]   Clinic Day:  06/03/2023  Referring physician: Clarene Critchley., MD  Patient Care Team: Default, Provider, MD as PCP - General   ASSESSMENT & PLAN:   Assessment:  1.  Stage II rectal cancer: - Diagnosed in 2019, treated with TNT.  Received 8 cycles of neoadjuvant chemotherapy with FOLFOX followed by chemoradiation therapy with Xeloda. - S/p APR in December 2019 with end colostomy creation. - He was found to have a soft tissue recurrence in the right perineum in March 2023, for which she received FOLFIRI plus panitumumab for oligometastatic disease.  Tumor was negative for KRAS, NRAS and BRAF. - This was followed by curative/salvage cystoprostatectomy/penilectomy in August 2023.  He did have deep margin involvement in 2 areas as well as pubic bone biopsies which were positive for adenocarcinoma.  He had a long recovery time.  2.  Social/family history: - He is independent of ADLs and IADLs.  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  He is a current active smoker, 1 pack/day started at age 89. - Brother had prostate cancer.  Another brother had skin cancer.  Plan:  1.  Recurrent rectal cancer: - He is switched her care to our cancer center as his previous oncologist is no longer in the network. - He denies any new onset pains. - I have recommended CT CAP with contrast, CEA and LFTs. - RTC after above.  2.  Hypertension: - Blood pressure today is 134/104.  He does not have a primary care physician. - I will prescribe him Norvasc 5 mg daily.  We will make a referral to establish primary care.   Orders Placed This Encounter  Procedures   CT CHEST ABDOMEN PELVIS W CONTRAST    Standing Status:   Future    Standing Expiration Date:   06/02/2024    Order Specific Question:   If indicated for the ordered procedure, I authorize the administration of contrast media per Radiology protocol     Answer:   Yes    Order Specific Question:   Does the patient have a contrast media/X-ray dye allergy?    Answer:   No    Order Specific Question:   Preferred imaging location?    Answer:   [facility]    Order Specific Question:   Release to patient    Answer:   Immediate    Order Specific Question:   If indicated for the ordered procedure, I authorize the administration of oral contrast media per Radiology protocol    Answer:   Yes   CBC with Differential    Standing Status:   Future    Number of Occurrences:   1    Standing Expiration Date:   06/02/2024   Comprehensive metabolic panel    Standing Status:   Future    Number of Occurrences:   1    Standing Expiration Date:   06/02/2024   CEA    Standing Status:   Future    Number of Occurrences:   1    Standing Expiration Date:   06/02/2024      I,Katie Daubenspeck,acting as a scribe for Doreatha Massed, MD.,have documented all relevant documentation on the behalf of Doreatha Massed, MD,as directed by  Doreatha Massed, MD while in the presence of Doreatha Massed, MD.   I, Doreatha Massed MD, have reviewed the above documentation for accuracy and completeness, and I  agree with the above.   Doreatha Massed, MD   10/30/20245:02 PM  CHIEF COMPLAINT/PURPOSE OF CONSULT:   Diagnosis: recurrent low rectal adenocarcinoma   Cancer Staging  No matching staging information was found for the patient.    Prior Therapy: 1. Neoadjuvant FOLFOX, 11/03/17 - 03/03/18 2. Concurrent chemoradiation with Xeloda, 03/22/18 - 05/02/18 3. Abdominoperineal resection, 07/20/18 4. FOLFIRI and pamitumumab, 11/06/21 - 03/2022 5. Cystoprostatectomy, 03/21/22 6. Total penectomy, 05/01/22  Current Therapy:  surveillance   HISTORY OF PRESENT ILLNESS:   Oncology History   No history exists.      Tanner Dudley is a 57 y.o. male presenting to clinic today for evaluation of recurrent low rectal adenocarcinoma at the request of Dr.  Glenard Haring at [facility].  In summary, he initially presented in 08/2017 with a one-year history of rectal bleeding. Colonoscopy from 09/16/17 revealed a lesion in distal rectum, and biopsy confirmed invasive adenocarcinoma. Staging scans were negative for metastatic disease. He was treated with neoadjuvant chemotherapy with FOLFOX, followed by radiation therapy with concurrent capecitabine, followed by abdominoperineal resection. Pathology from definitive surgery showed two separate invasive rectal adenocarcinomas, measuring 3 cm and 0.8 cm and staged ypT3 and ypT1 respectively. All margins and 14 lymph nodes were negative. He was then placed on surveillance.  He was unfortunately found to have recurrent oligometastatic disease in right perineum on surveillance scan in 10/2021. He was treated with FOLFIRI with panitumumab, followed by cystoprostatectomy, and then total penectomy. He is now back on surveillance.  Today, he states that he is doing well overall. His appetite level is at 50%. His energy level is at 20%.  PAST MEDICAL HISTORY:   Past Medical History: Past Medical History:  Diagnosis Date   HOH (hard of hearing)    Medical history non-contributory    Rectal cancer [facility])     Surgical History: Past Surgical History:  Procedure Laterality Date   BIOPSY  09/16/2017   Procedure: BIOPSY;  Surgeon: Corbin Ade, MD;  Location: [facility];  Service: Gastroenterology;;  anal rectal   COLONOSCOPY WITH PROPOFOL N/A 09/16/2017   Procedure: COLONOSCOPY WITH PROPOFOL;  Surgeon: Corbin Ade, MD;  Location: [facility];  Service: Gastroenterology;  Laterality: N/A;   COLOSTOMY N/A 10/09/2017   Procedure: DIVERTING LOOP COLOSTOMY (PALLATIVE);  Surgeon: Franky Macho, MD;  Location: [facility];  Service: General;  Laterality: N/A;   None      Social History: Social History   Socioeconomic History   Marital status: Legally Separated    Spouse name: Not on file   Number of children: 0   Years of  education: Not on file   Highest education level: 12th grade  Occupational History   Occupation: self employed  Tobacco Use   Smoking status: Every Day    Current packs/day: 0.50    Average packs/day: 0.5 packs/day for 25.8 years (12.9 ttl pk-yrs)    Types: Cigarettes    Start date: 08/14/1997   Smokeless tobacco: Never  Vaping Use   Vaping status: Never Used  Substance and Sexual Activity   Alcohol use: Not Currently    Comment: 2-3 beers, 3-4 times per week    Drug use: No   Sexual activity: Not Currently  Other Topics Concern   Not on file  Social History Narrative   Separated for 6 weeks as of 09/24/17   No biological children   Social Determinants of Health   Financial Resource Strain: Low Risk  (07/18/2022)   [facility]   Overall Financial Resource Strain (CARDIA)    Difficulty of Paying Living Expenses: Not very hard  Food Insecurity: No Food Insecurity (06/03/2023)   Hunger Vital Sign    Worried About Running Out of Food in the Last Year: Never true    Ran Out of Food in the Last Year: Never true  Transportation Needs: No Transportation Needs (06/03/2023)   PRAPARE - Administrator, Civil Service (Medical): No    Lack of Transportation (Non-Medical): No  Physical Activity: Inactive (10/15/2020)   Received from [facility], [facility]   Exercise Vital Sign    Days of Exercise per Week: 0 days    Minutes of Exercise per Session: 0 min  Stress: No Stress Concern Present (10/15/2021)   Received from [facility], [facility] - Occupational Stress Questionnaire    Feeling of Stress : Not at all  Social Connections: Moderately Isolated (10/02/2021)   Received from [facility], [facility]   Social Connection and Isolation Panel [NHANES]    Frequency of Communication with Friends and Family: More than three times a week    Frequency of Social Gatherings with Friends and  Family: Never    Attends Religious Services: Never    Database administrator or Organizations: No    Attends Banker Meetings: Never    Marital Status: Married  Catering manager Violence: Not At Risk (06/03/2023)   Humiliation, Afraid, Rape, and Kick questionnaire    Fear of Current or Ex-Partner: No    Emotionally Abused: No    Physically Abused: No    Sexually Abused: No    Family History: Family History  Problem Relation Age of Onset   Colon cancer Father        diagnosed in his 70s, succumbed to disease   Diabetes Mother    Heart attack Mother    Hypertension Mother    Pancreatic cancer Sister    Prostate cancer Brother    Melanoma Brother    Inflammatory bowel disease Neg Hx    Colon polyps Neg Hx     Current Medications:  Current Outpatient Medications:    amLODipine (NORVASC) 5 MG tablet, Take 1 tablet (5 mg total) by mouth daily., Disp: 30 tablet, Rfl: 1   atorvastatin (LIPITOR) 20 MG tablet, Take 20 mg by mouth at bedtime., Disp: , Rfl:    azelastine (ASTELIN) 0.1 % nasal spray, Place 1 spray into both nostrils in the morning., Disp: , Rfl:    cyanocobalamin (VITAMIN B12) 1000 MCG tablet, Take 1 tablet by mouth daily., Disp: , Rfl:    diphenoxylate-atropine (LOMOTIL) 2.5-0.025 MG tablet, Take 1 tablet by mouth 4 (four) times daily as needed. (Patient not taking: Reported on 06/03/2023), Disp: , Rfl:    escitalopram (LEXAPRO) 20 MG tablet, Take 1 tablet (20 mg total) by mouth daily., Disp: 30 tablet, Rfl: 1   ondansetron (ZOFRAN) 8 MG tablet, Take 8 mg by mouth every 8 (eight) hours as needed. (Patient not taking: Reported on 06/03/2023), Disp: , Rfl:    potassium chloride (KLOR-CON) 10 MEQ tablet, Take 10 mEq by mouth daily. (Patient not taking: Reported on 06/03/2023), Disp: , Rfl:    Allergies: No Known Allergies  REVIEW OF SYSTEMS:   Review of Systems  Constitutional:  Negative for chills, fatigue and fever.  HENT:   Negative for lump/mass,  mouth sores, nosebleeds, sore throat  and trouble swallowing.   Eyes:  Negative for eye problems.  Respiratory:  Positive for shortness of breath. Negative for cough.   Cardiovascular:  Negative for chest pain, leg swelling and palpitations.  Gastrointestinal:  Positive for nausea. Negative for abdominal pain, constipation, diarrhea and vomiting.  Genitourinary:  Negative for bladder incontinence, difficulty urinating, dysuria, frequency, hematuria and nocturia.   Musculoskeletal:  Negative for arthralgias, back pain, flank pain, myalgias and neck pain.  Skin:  Negative for itching and rash.  Neurological:  Positive for dizziness. Negative for headaches.  Hematological:  Does not bruise/bleed easily.  Psychiatric/Behavioral:  Positive for sleep disturbance. Negative for depression and suicidal ideas. The patient is not nervous/anxious.   All other systems reviewed and are negative.    VITALS:   Blood pressure (!) 134/104, pulse (!) 107, temperature 98.3 F (36.8 C), temperature source Oral, resp. rate 16, height 5\' 7"  (1.702 m), weight 203 lb 6.4 oz (92.3 kg), SpO2 96%.  Wt Readings from Last 3 Encounters:  06/03/23 203 lb 6.4 oz (92.3 kg)  07/30/20 210 lb (95.3 kg)  10/09/17 148 lb 9.4 oz (67.4 kg)    Body mass index is 31.86 kg/m.  Performance status (ECOG): 1 - Symptomatic but completely ambulatory  PHYSICAL EXAM:   Physical Exam Vitals and nursing note reviewed. Exam conducted with a chaperone present.  Constitutional:      Appearance: Normal appearance.  Cardiovascular:     Rate and Rhythm: Normal rate and regular rhythm.     Pulses: Normal pulses.     Heart sounds: Normal heart sounds.  Pulmonary:     Effort: Pulmonary effort is normal.     Breath sounds: Normal breath sounds.  Abdominal:     Palpations: Abdomen is soft. There is no hepatomegaly, splenomegaly or mass.     Tenderness: There is no abdominal tenderness.  Musculoskeletal:     Right lower leg: No edema.      Left lower leg: No edema.  Lymphadenopathy:     Cervical: No cervical adenopathy.     Right cervical: No superficial, deep or posterior cervical adenopathy.    Left cervical: No superficial, deep or posterior cervical adenopathy.     Upper Body:     Right upper body: No supraclavicular or axillary adenopathy.     Left upper body: No supraclavicular or axillary adenopathy.  Neurological:     General: No focal deficit present.     Mental Status: He is alert and oriented to person, place, and time.  Psychiatric:        Mood and Affect: Mood normal.        Behavior: Behavior normal.   Left-sided colostomy.  LABS:      Latest Ref Rng & Units 06/03/2023    1:55 PM 10/11/2017    6:48 AM 10/10/2017    4:21 AM  CBC  WBC 4.0 - 10.5 K/uL 4.5  7.3  5.8   Hemoglobin 13.0 - 17.0 g/dL 96.0  45.4  09.8   Hematocrit 39.0 - 52.0 % 56.9  31.1  31.6   Platelets 150 - 400 K/uL 278  455  469       Latest Ref Rng & Units 06/03/2023    1:55 PM 10/11/2017    6:48 AM 10/10/2017    4:21 AM  CMP  Glucose 70 - 99 mg/dL 97  89  95   BUN 6 - 20 mg/dL 9  8  8    Creatinine 0.61 - 1.24 mg/dL  1.17  0.74  0.83   Sodium 135 - 145 mmol/L 137  140  140   Potassium 3.5 - 5.1 mmol/L 4.0  3.8  4.1   Chloride 98 - 111 mmol/L 101  106  105   CO2 22 - 32 mmol/L 24  27  25    Calcium 8.9 - 10.3 mg/dL 9.4  8.5  8.7   Total Protein 6.5 - 8.1 g/dL 8.1     Total Bilirubin 0.3 - 1.2 mg/dL 1.3     Alkaline Phos 38 - 126 U/L 53     AST 15 - 41 U/L 40     ALT 0 - 44 U/L 59        Lab Results  Component Value Date   CEA1 73.4 (H) 09/24/2017   /  CEA  Date Value Ref Range Status  09/24/2017 73.4 (H) 0.0 - 4.7 ng/mL Final    Comment:    (NOTE)                             Nonsmokers          <3.9                             Smokers             <5.6 [employer] Electrochemiluminescence Immunoassay (ECLIA) Values obtained with different assay methods or kits cannot be used interchangeably.  Results cannot  be interpreted as absolute evidence of the presence or absence of malignant disease. Performed At: [facility] [address] [place], [place] [id number] Jolene Schimke MD WU:[id number] Performed at [facility], [address], [place], [place] [address]    No results found for: "PSA1" No results found for: "[phone]" No results found for: "CAN125"  No results found for: "TOTALPROTELP", "ALBUMINELP", "A1GS", "A2GS", "BETS", "BETA2SER", "GAMS", "MSPIKE", "SPEI" Lab Results  Component Value Date   FERRITIN 16 (L) 09/24/2017   Lab Results  Component Value Date   LDH 120 09/24/2017     STUDIES:   No results found.

## 2023-06-04 LAB — CEA: CEA: 2.3 ng/mL (ref 0.0–4.7)

## 2023-06-09 ENCOUNTER — Ambulatory Visit (HOSPITAL_COMMUNITY)
Admission: RE | Admit: 2023-06-09 | Discharge: 2023-06-09 | Disposition: A | Discharge status: Home / Self Care | Payer: 59 | Source: Ambulatory Visit | Attending: Hematology | Admitting: Hematology

## 2023-06-09 DIAGNOSIS — M7989 Other specified soft tissue disorders: Secondary | ICD-10-CM | POA: Diagnosis not present

## 2023-06-09 DIAGNOSIS — C20 Malignant neoplasm of rectum: Secondary | ICD-10-CM | POA: Insufficient documentation

## 2023-06-09 DIAGNOSIS — J9811 Atelectasis: Secondary | ICD-10-CM | POA: Diagnosis not present

## 2023-06-09 MED ORDER — IOHEXOL 300 MG/ML  SOLN
100.0000 mL | Freq: Once | INTRAMUSCULAR | Status: AC | PRN
  Administered 2023-06-09: 100 mL via INTRAVENOUS

## 2023-06-15 NOTE — Progress Notes (Signed)
[facility] [address], [place] [address]   Clinic Day:  06/16/2023  Referring physician: Clarene Critchley., MD  Patient Care Team: Default, Provider, MD as PCP - General   ASSESSMENT & PLAN:   Assessment:  1.  Stage II rectal cancer: - Diagnosed in 2019, treated with TNT.  Received 8 cycles of neoadjuvant chemotherapy with FOLFOX followed by chemoradiation therapy with Xeloda. - S/p APR in December 2019 with end colostomy creation. - He was found to have a soft tissue recurrence in the right perineum in March 2023, for which she received FOLFIRI plus panitumumab for oligometastatic disease.  Tumor was negative for KRAS, NRAS and BRAF. - This was followed by curative/salvage cystoprostatectomy/penilectomy in August 2023.  He did have deep margin involvement in 2 areas as well as pubic bone biopsies which were positive for adenocarcinoma.  He had a long recovery time. - He is switched care to our cancer center as previous oncologist is no longer in the network.  2.  Social/family history: - He is independent of ADLs and IADLs.  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  He is a current active smoker, 1 pack/day started at age 40. - Brother had prostate cancer.  Another brother had skin cancer.  Plan:  1.  Recurrent rectal cancer: - We discussed labs from 06/03/2023: ALT is elevated at 59.  Rest of LFTs are grossly normal.  CEA level was normal at 2.3. - CT CAP (06/09/2023): Heterogeneous soft tissue nodularity along the pelvic floor, nonspecific.  New segment 4 hypodensity along the falciform ligament measuring 18 mm.  Differential includes focal fatty infiltration.  No evidence of metastatic disease within the chest. - Based on these findings, I have recommended further workup with MRI of the abdomen and pelvis. - We will do a phone visit after the above. - Otherwise I will see him back in 6 months with repeat CTAP with contrast, CEA and  CBC and CMP.  2.  Hypertension: - I have started him on Norvasc 5 mg daily at last visit.  Blood pressure today is 130/89. - Will make referral to establish primary care.  3.  Erythrocytosis: - CBC on 06/03/2023 showed hemoglobin 19.3, hematocrit 57, RBC count 6.22.  All his previous values were within normal or in the anemic range.  We discussed causes of primary and secondary processes.  He is a current active 1 pack/day smoker. - We will repeat CBC today.  If hemoglobin continues to stay high, will consider workup including serum EPO level and JAK2 V617F with reflex testing.   Orders Placed This Encounter  Procedures   MR Abdomen W Wo Contrast    Standing Status:   Future    Standing Expiration Date:   06/15/2024    Order Specific Question:   If indicated for the ordered procedure, I authorize the administration of contrast media per Radiology protocol    Answer:   Yes    Order Specific Question:   What is the patient's sedation requirement?    Answer:   No Sedation    Order Specific Question:   Does the patient have a pacemaker or implanted devices?    Answer:   No    Order Specific Question:   Preferred imaging location?    Answer:   [facility] (table limit [phone])   MR PELVIS W WO CONTRAST    Standing Status:   Future  Standing Expiration Date:   06/15/2024    Order Specific Question:   If indicated for the ordered procedure, I authorize the administration of contrast media per Radiology protocol    Answer:   Yes    Order Specific Question:   What is the patient's sedation requirement?    Answer:   No Sedation    Order Specific Question:   Does the patient have a pacemaker or implanted devices?    Answer:   No    Order Specific Question:   Preferred imaging location?    Answer:   [facility] (table limit [phone])   CT ABDOMEN PELVIS W CONTRAST    Standing Status:   Future    Standing Expiration Date:   06/15/2024    Order Specific Question:   If  indicated for the ordered procedure, I authorize the administration of contrast media per Radiology protocol    Answer:   Yes    Order Specific Question:   Does the patient have a contrast media/X-ray dye allergy?    Answer:   No    Order Specific Question:   Preferred imaging location?    Answer:   [facility]    Order Specific Question:   If indicated for the ordered procedure, I authorize the administration of oral contrast media per Radiology protocol    Answer:   Yes   CBC    Standing Status:   Future    Number of Occurrences:   1    Standing Expiration Date:   06/15/2024    Order Specific Question:   Remote health to draw?    Answer:   No      I,Helena R Teague,acting as a scribe for Doreatha Massed, MD.,have documented all relevant documentation on the behalf of Doreatha Massed, MD,as directed by  Doreatha Massed, MD while in the presence of Doreatha Massed, MD.  I, Doreatha Massed MD, have reviewed the above documentation for accuracy and completeness, and I agree with the above.    Doreatha Massed, MD   11/12/20241:01 PM  CHIEF COMPLAINT/PURPOSE OF CONSULT:   Diagnosis: recurrent low rectal adenocarcinoma   Cancer Staging  No matching staging information was found for the patient.    Prior Therapy: 1. Neoadjuvant FOLFOX, 11/03/17 - 03/03/18 2. Concurrent chemoradiation with Xeloda, 03/22/18 - 05/02/18 3. Abdominoperineal resection, 07/20/18 4. FOLFIRI and pamitumumab, 11/06/21 - 03/2022 5. Cystoprostatectomy, 03/21/22 6. Total penectomy, 05/01/22  Current Therapy:  surveillance   HISTORY OF PRESENT ILLNESS:   Oncology History   No history exists.      Tanner Dudley is a 57 y.o. male presenting to clinic today for evaluation of recurrent low rectal adenocarcinoma at the request of Dr. Glenard Haring at [facility].  In summary, he initially presented in 08/2017 with a one-year history of rectal bleeding. Colonoscopy from 09/16/17 revealed a lesion in distal  rectum, and biopsy confirmed invasive adenocarcinoma. Staging scans were negative for metastatic disease. He was treated with neoadjuvant chemotherapy with FOLFOX, followed by radiation therapy with concurrent capecitabine, followed by abdominoperineal resection. Pathology from definitive surgery showed two separate invasive rectal adenocarcinomas, measuring 3 cm and 0.8 cm and staged ypT3 and ypT1 respectively. All margins and 14 lymph nodes were negative. He was then placed on surveillance.  He was unfortunately found to have recurrent oligometastatic disease in right perineum on surveillance scan in 10/2021. He was treated with FOLFIRI with panitumumab, followed by cystoprostatectomy, and then total penectomy. He is now back on surveillance.  Today, he states that he is doing well overall. His appetite level is at 50%. His energy level is at 20%.  INTERVAL HISTORY:   Ken Cambray is a 57 y.o. male presenting to the clinic today for follow-up of recurrent low rectal adenocarcinoma. He was last seen by me on 06/03/23 in consultation.  Since his last visit, he underwent CT C/A/P on 06/09/23 that found: heterogenous soft tissue nodularity along the pelvic floor, is nonspecific possibly reflecting postsurgical change; new segment IV hypodensity along the falciform ligament measuring 18 mm is region which commonly reflects focal fatty infiltration or differential perfusion but is nonspecific; and no evidence of metastatic disease within the chest.  Today, he states that he is doing well overall. His appetite level is at 60%. His energy level is at 40%. He has a tobacco use of 1 ppd. He notes he does not drink adequate amounts of water.   PAST MEDICAL HISTORY:   Past Medical History: Past Medical History:  Diagnosis Date   HOH (hard of hearing)    Medical history non-contributory    Rectal cancer [facility])     Surgical History: Past Surgical History:  Procedure Laterality Date   BIOPSY  09/16/2017    Procedure: BIOPSY;  Surgeon: Corbin Ade, MD;  Location: [facility];  Service: Gastroenterology;;  anal rectal   COLONOSCOPY WITH PROPOFOL N/A 09/16/2017   Procedure: COLONOSCOPY WITH PROPOFOL;  Surgeon: Corbin Ade, MD;  Location: [facility];  Service: Gastroenterology;  Laterality: N/A;   COLOSTOMY N/A 10/09/2017   Procedure: DIVERTING LOOP COLOSTOMY (PALLATIVE);  Surgeon: Franky Macho, MD;  Location: [facility] ORS;  Service: General;  Laterality: N/A;   None      Social History: Social History   Socioeconomic History   Marital status: Legally Separated    Spouse name: Not on file   Number of children: 0   Years of education: Not on file   Highest education level: 12th grade  Occupational History   Occupation: self employed  Tobacco Use   Smoking status: Every Day    Current packs/day: 0.50    Average packs/day: 0.5 packs/day for 25.8 years (12.9 ttl pk-yrs)    Types: Cigarettes    Start date: 08/14/1997   Smokeless tobacco: Never  Vaping Use   Vaping status: Never Used  Substance and Sexual Activity   Alcohol use: Not Currently    Comment: 2-3 beers, 3-4 times per week    Drug use: No   Sexual activity: Not Currently  Other Topics Concern   Not on file  Social History Narrative   Separated for 6 weeks as of 09/24/17   No biological children   Social Determinants of Health   Financial Resource Strain: Low Risk  (07/18/2022)   Received from [facility]   Overall Financial Resource Strain (CARDIA)    Difficulty of Paying Living Expenses: Not very hard  Food Insecurity: No Food Insecurity (06/03/2023)   Hunger Vital Sign    Worried About Running Out of Food in the Last Year: Never true    Ran Out of Food in the Last Year: Never true  Transportation Needs: No Transportation Needs (06/03/2023)   PRAPARE - Administrator, Civil Service (Medical): No    Lack of Transportation (Non-Medical): No  Physical Activity: Inactive (10/15/2020)   Received  from [facility]   Exercise Vital Sign    Days of Exercise per Week: 0 days  Minutes of Exercise per Session: 0 min  Stress: No Stress Concern Present (10/15/2021)   Received from [facility], [facility]   [facility] of Occupational Health - Occupational Stress Questionnaire    Feeling of Stress : Not at all  Social Connections: Moderately Isolated (10/02/2021)   Received from [facility], [facility]   Social Connection and Isolation Panel [NHANES]    Frequency of Communication with Friends and Family: More than three times a week    Frequency of Social Gatherings with Friends and Family: Never    Attends Religious Services: Never    Database administrator or Organizations: No    Attends Banker Meetings: Never    Marital Status: Married  Catering manager Violence: Not At Risk (06/03/2023)   Humiliation, Afraid, Rape, and Kick questionnaire    Fear of Current or Ex-Partner: No    Emotionally Abused: No    Physically Abused: No    Sexually Abused: No    Family History: Family History  Problem Relation Age of Onset   Colon cancer Father        diagnosed in his 62s, succumbed to disease   Diabetes Mother    Heart attack Mother    Hypertension Mother    Pancreatic cancer Sister    Prostate cancer Brother    Melanoma Brother    Inflammatory bowel disease Neg Hx    Colon polyps Neg Hx     Current Medications:  Current Outpatient Medications:    amLODipine (NORVASC) 5 MG tablet, Take 1 tablet (5 mg total) by mouth daily., Disp: 30 tablet, Rfl: 1   atorvastatin (LIPITOR) 20 MG tablet, Take 20 mg by mouth at bedtime., Disp: , Rfl:    azelastine (ASTELIN) 0.1 % nasal spray, Place 1 spray into both nostrils in the morning., Disp: , Rfl:    cyanocobalamin (VITAMIN B12) 1000 MCG tablet, Take 1 tablet by mouth daily., Disp: , Rfl:    diphenoxylate-atropine (LOMOTIL) 2.5-0.025 MG tablet, Take 1 tablet by mouth 4 (four) times  daily as needed., Disp: , Rfl:    escitalopram (LEXAPRO) 20 MG tablet, Take 1 tablet (20 mg total) by mouth daily., Disp: 30 tablet, Rfl: 1   ondansetron (ZOFRAN) 8 MG tablet, Take 8 mg by mouth every 8 (eight) hours as needed., Disp: , Rfl:    potassium chloride (KLOR-CON) 10 MEQ tablet, Take 10 mEq by mouth daily., Disp: , Rfl:    Allergies: No Known Allergies  REVIEW OF SYSTEMS:   Review of Systems  Constitutional:  Negative for chills, fatigue and fever.  HENT:   Negative for lump/mass, mouth sores, nosebleeds, sore throat and trouble swallowing.   Eyes:  Negative for eye problems.  Respiratory:  Positive for shortness of breath. Negative for cough.   Cardiovascular:  Negative for chest pain, leg swelling and palpitations.  Gastrointestinal:  Negative for abdominal pain, constipation, diarrhea, nausea and vomiting.  Genitourinary:  Negative for bladder incontinence, difficulty urinating, dysuria, frequency, hematuria and nocturia.   Musculoskeletal:  Negative for arthralgias, back pain, flank pain, myalgias and neck pain.  Skin:  Negative for itching and rash.  Neurological:  Negative for dizziness, headaches and numbness.  Hematological:  Does not bruise/bleed easily.  Psychiatric/Behavioral:  Negative for depression, sleep disturbance and suicidal ideas. The patient is nervous/anxious.   All other systems reviewed and are negative.    VITALS:   Blood pressure 132/89, pulse 100, temperature 97.9 F (36.6 C), temperature  source Oral, resp. rate 18, height 5\' 7"  (1.702 m), weight 199 lb 3.2 oz (90.4 kg), SpO2 94%.  Wt Readings from Last 3 Encounters:  06/16/23 199 lb 3.2 oz (90.4 kg)  06/03/23 203 lb 6.4 oz (92.3 kg)  07/30/20 210 lb (95.3 kg)    Body mass index is 31.2 kg/m.  Performance status (ECOG): 1 - Symptomatic but completely ambulatory  PHYSICAL EXAM:   Physical Exam Vitals and nursing note reviewed. Exam conducted with a chaperone present.  Constitutional:       Appearance: Normal appearance.  Cardiovascular:     Rate and Rhythm: Normal rate and regular rhythm.     Pulses: Normal pulses.     Heart sounds: Normal heart sounds.  Pulmonary:     Effort: Pulmonary effort is normal.     Breath sounds: Normal breath sounds.  Abdominal:     Palpations: Abdomen is soft. There is no hepatomegaly, splenomegaly or mass.     Tenderness: There is no abdominal tenderness.  Musculoskeletal:     Right lower leg: No edema.     Left lower leg: No edema.  Lymphadenopathy:     Cervical: No cervical adenopathy.     Right cervical: No superficial, deep or posterior cervical adenopathy.    Left cervical: No superficial, deep or posterior cervical adenopathy.     Upper Body:     Right upper body: No supraclavicular or axillary adenopathy.     Left upper body: No supraclavicular or axillary adenopathy.  Neurological:     General: No focal deficit present.     Mental Status: He is alert and oriented to person, place, and time.  Psychiatric:        Mood and Affect: Mood normal.        Behavior: Behavior normal.   Left-sided colostomy.  LABS:      Latest Ref Rng & Units 06/16/2023   11:04 AM 06/03/2023    1:55 PM 10/11/2017    6:48 AM  CBC  WBC 4.0 - 10.5 K/uL 4.8  4.5  7.3   Hemoglobin 13.0 - 17.0 g/dL 78.2  95.6  21.3   Hematocrit 39.0 - 52.0 % 55.5  56.9  31.1   Platelets 150 - 400 K/uL 305  278  455       Latest Ref Rng & Units 06/03/2023    1:55 PM 10/11/2017    6:48 AM 10/10/2017    4:21 AM  CMP  Glucose 70 - 99 mg/dL 97  89  95   BUN 6 - 20 mg/dL 9  8  8    Creatinine 0.61 - 1.24 mg/dL 0.86  5.78  4.69   Sodium 135 - 145 mmol/L 137  140  140   Potassium 3.5 - 5.1 mmol/L 4.0  3.8  4.1   Chloride 98 - 111 mmol/L 101  106  105   CO2 22 - 32 mmol/L 24  27  25    Calcium 8.9 - 10.3 mg/dL 9.4  8.5  8.7   Total Protein 6.5 - 8.1 g/dL 8.1     Total Bilirubin 0.3 - 1.2 mg/dL 1.3     Alkaline Phos 38 - 126 U/L 53     AST 15 - 41 U/L 40     ALT 0 -  44 U/L 59        Lab Results  Component Value Date   CEA1 2.3 06/03/2023   /  CEA  Date Value Ref Range Status  06/03/2023  2.3 0.0 - 4.7 ng/mL Final    Comment:    (NOTE)                             Nonsmokers          <3.9                             Smokers             <5.6 Roche [employer] Electrochemiluminescence Immunoassay (ECLIA) Values obtained with different assay methods or kits cannot be used interchangeably.  Results cannot be interpreted as absolute evidence of the presence or absence of malignant disease. Performed At: [facility] [address] [place], [place] [id number] Jolene Schimke MD NW:[id number]    No results found for: "PSA1" No results found for: [phone]" No results found for: "CAN125"  No results found for: "TOTALPROTELP", "ALBUMINELP", "A1GS", "A2GS", "BETS", "BETA2SER", "GAMS", "MSPIKE", "SPEI" Lab Results  Component Value Date   FERRITIN 16 (L) 09/24/2017   Lab Results  Component Value Date   LDH 120 09/24/2017     STUDIES:   CT CHEST ABDOMEN PELVIS W CONTRAST  Result Date: 06/09/2023 CLINICAL DATA:  History of stage II rectal cancer, monitor. * Tracking Code: BO * EXAM: CT CHEST, ABDOMEN, AND PELVIS WITH CONTRAST TECHNIQUE: Multidetector CT imaging of the chest, abdomen and pelvis was performed following the standard protocol during bolus administration of intravenous contrast. RADIATION DOSE REDUCTION: This exam was performed according to the departmental dose-optimization program which includes automated exposure control, adjustment of the mA and/or kV according to patient size and/or use of iterative reconstruction technique. CONTRAST:  OMNIPAQUE IOHEXOL 300 MG/ML  SOLN COMPARISON:  PET-CT August 15, 2022 and CT abdomen pelvis February 07, 2022. FINDINGS: CT CHEST FINDINGS Cardiovascular: Left chest Port-A-Cath with tip near the superior cavoatrial junction. Normal caliber thoracic aorta. No central pulmonary embolus on this  nondedicated study. Normal size heart. No significant pericardial effusion/thickening. Mediastinum/Nodes: No suspicious thyroid nodule. No pathologically enlarged mediastinal, hilar or axillary lymph nodes. The esophagus is grossly unremarkable. Lungs/Pleura: No suspicious pulmonary nodules or masses. Scattered atelectasis/scarring. No pleural effusion. No pneumothorax. Musculoskeletal: No aggressive lytic or blastic lesion of bone. Diffuse idiopathic skeletal hyperostosis. CT ABDOMEN PELVIS FINDINGS Hepatobiliary: Hyperenhancing focus in the dome of the liver seen on prior examination is not identified on today's examination. New segment IV hypodensity along the falciform ligament measuring 18 mm on image 54/2 is region which commonly reflects focal fatty infiltration or differential perfusion but is nonspecific. Gallbladder is unremarkable. No biliary ductal dilation. Pancreas: No pancreatic ductal dilation or evidence of acute inflammation. Spleen: No splenomegaly. Adrenals/Urinary Tract: Bilateral adrenal glands appear normal. No hydronephrosis. Kidneys demonstrate symmetric enhancement. Urinary bladder is nondistended limiting evaluation. Stomach/Bowel: Status post abdominoperineal resection with left lower quadrant end colostomy. Heterogeneous soft tissue nodularity along the pelvic floor for instance a discrete nodule measuring 3.3 cm on image 113/2 and another nodular focus anteriorly measuring 4.0 x 3.3 cm on image 101/2. No evidence of bowel obstruction or acute bowel inflammation. Vascular/Lymphatic: Normal caliber abdominal aorta. Smooth IVC contours. The portal, splenic and superior mesenteric veins are patent. Reproductive: Prostate gland is surgically absent. Other: Bilateral anterior abdominal wall ostomies. No significant abdominopelvic free fluid. Musculoskeletal: No aggressive lytic or blastic lesion of bone. Multilevel degenerative changes spine. IMPRESSION: 1. Status post abdominoperineal  resection with left lower quadrant end  colostomy. Heterogeneous soft tissue nodularity along the pelvic floor, is nonspecific possibly reflecting postsurgical change, consider further evaluation by pelvic MRI with and without contrast or short-term interval follow-up CT. 2. New segment IV hypodensity along the falciform ligament measuring 18 mm is region which commonly reflects focal fatty infiltration or differential perfusion but is nonspecific. Suggest further evaluation with a liver protocol MRI with and without contrast. 3. No evidence of metastatic disease within the chest. Electronically Signed   By: Maudry Mayhew M.D.   On: 06/09/2023 15:23

## 2023-06-16 ENCOUNTER — Telehealth: Payer: Self-pay | Admitting: *Deleted

## 2023-06-16 ENCOUNTER — Inpatient Hospital Stay: Payer: 59

## 2023-06-16 ENCOUNTER — Inpatient Hospital Stay: Payer: 59 | Attending: Hematology | Admitting: Hematology

## 2023-06-16 ENCOUNTER — Other Ambulatory Visit: Payer: Self-pay | Admitting: *Deleted

## 2023-06-16 VITALS — BP 132/89 | HR 100 | Temp 97.9°F | Resp 18 | Ht 67.0 in | Wt 199.2 lb

## 2023-06-16 DIAGNOSIS — Z800 Family history of malignant neoplasm of digestive organs: Secondary | ICD-10-CM | POA: Diagnosis not present

## 2023-06-16 DIAGNOSIS — D751 Secondary polycythemia: Secondary | ICD-10-CM | POA: Insufficient documentation

## 2023-06-16 DIAGNOSIS — C20 Malignant neoplasm of rectum: Secondary | ICD-10-CM

## 2023-06-16 DIAGNOSIS — I10 Essential (primary) hypertension: Secondary | ICD-10-CM | POA: Diagnosis not present

## 2023-06-16 DIAGNOSIS — Z79899 Other long term (current) drug therapy: Secondary | ICD-10-CM | POA: Diagnosis not present

## 2023-06-16 DIAGNOSIS — R9389 Abnormal findings on diagnostic imaging of other specified body structures: Secondary | ICD-10-CM | POA: Diagnosis not present

## 2023-06-16 DIAGNOSIS — Z833 Family history of diabetes mellitus: Secondary | ICD-10-CM | POA: Insufficient documentation

## 2023-06-16 DIAGNOSIS — Z8042 Family history of malignant neoplasm of prostate: Secondary | ICD-10-CM | POA: Insufficient documentation

## 2023-06-16 DIAGNOSIS — F17210 Nicotine dependence, cigarettes, uncomplicated: Secondary | ICD-10-CM | POA: Diagnosis not present

## 2023-06-16 DIAGNOSIS — Z933 Colostomy status: Secondary | ICD-10-CM | POA: Insufficient documentation

## 2023-06-16 DIAGNOSIS — Z808 Family history of malignant neoplasm of other organs or systems: Secondary | ICD-10-CM | POA: Diagnosis not present

## 2023-06-16 DIAGNOSIS — Z8249 Family history of ischemic heart disease and other diseases of the circulatory system: Secondary | ICD-10-CM | POA: Diagnosis not present

## 2023-06-16 DIAGNOSIS — R0602 Shortness of breath: Secondary | ICD-10-CM | POA: Diagnosis not present

## 2023-06-16 LAB — CBC
HCT: 55.5 % — ABNORMAL HIGH (ref 39.0–52.0)
Hemoglobin: 19 g/dL — ABNORMAL HIGH (ref 13.0–17.0)
MCH: 31.1 pg (ref 26.0–34.0)
MCHC: 34.2 g/dL (ref 30.0–36.0)
MCV: 90.8 fL (ref 80.0–100.0)
Platelets: 305 10*3/uL (ref 150–400)
RBC: 6.11 MIL/uL — ABNORMAL HIGH (ref 4.22–5.81)
RDW: 13.2 % (ref 11.5–15.5)
WBC: 4.8 10*3/uL (ref 4.0–10.5)
nRBC: 0 % (ref 0.0–0.2)

## 2023-06-16 NOTE — Telephone Encounter (Signed)
Patient made aware of rationale behind additional labs requested by Dr. Ellin Saba.  Patient verbalized understanding and will have drawn tomorrow.

## 2023-06-16 NOTE — Patient Instructions (Addendum)
[facility] at [facility] Discharge Instructions   You were seen and examined today by Dr. Ellin Saba.   He reviewed the results of your lab work we did on 06/03/23. Your hemoglobin was elevated at 19.4. We will repeat this today. All other labs were normal/stable.   We will obtain MRI of the abdomen and pelvis.  We will see you back in 6 months. We will repeat lab work and scans prior to this visit.   Return as scheduled.    Thank you for choosing [facility] at [facility] to provide your oncology and hematology care.  To afford each patient quality time with our provider, please arrive at least 15 minutes before your scheduled appointment time.   If you have a lab appointment with the Cancer Center please come in thru the Main Entrance and check in at the main information desk.  You need to re-schedule your appointment should you arrive 10 or more minutes late.  We strive to give you quality time with our providers, and arriving late affects you and other patients whose appointments are after yours.  Also, if you no show three or more times for appointments you may be dismissed from the clinic at the providers discretion.     Again, thank you for choosing [facility].  Our hope is that these requests will decrease the amount of time that you wait before being seen by our physicians.       _____________________________________________________________  Should you have questions after your visit to [facility], please contact our office at [phone] and follow the prompts.  Our office hours are 8:00 a.m. and 4:30 p.m. Monday - Friday.  Please note that voicemails left after 4:00 p.m. may not be returned until the following business day.  We are closed weekends and major holidays.  You do have access to a nurse 24-7, just call the main number to the clinic [phone] and do not press any options, hold on the line and  a nurse will answer the phone.    For prescription refill requests, have your pharmacy contact our office and allow 72 hours.    Due to Covid, you will need to wear a mask upon entering the hospital. If you do not have a mask, a mask will be given to you at the Main Entrance upon arrival. For doctor visits, patients may have 1 support person age 26 or older with them. For treatment visits, patients can not have anyone with them due to social distancing guidelines and our immunocompromised population.

## 2023-06-17 ENCOUNTER — Inpatient Hospital Stay: Payer: 59

## 2023-06-17 DIAGNOSIS — C20 Malignant neoplasm of rectum: Secondary | ICD-10-CM | POA: Diagnosis not present

## 2023-06-17 DIAGNOSIS — D751 Secondary polycythemia: Secondary | ICD-10-CM

## 2023-06-18 LAB — ERYTHROPOIETIN: Erythropoietin: 6 m[IU]/mL (ref 2.6–18.5)

## 2023-06-23 LAB — JAK2 V617F RFX CALR/MPL/E12-15

## 2023-06-23 LAB — CALR +MPL + E12-E15  (REFLEX)

## 2023-06-24 ENCOUNTER — Ambulatory Visit (HOSPITAL_COMMUNITY)
Admission: RE | Admit: 2023-06-24 | Discharge: 2023-06-24 | Disposition: A | Discharge status: Home / Self Care | Payer: 59 | Source: Ambulatory Visit | Attending: Hematology | Admitting: Hematology

## 2023-06-24 DIAGNOSIS — C20 Malignant neoplasm of rectum: Secondary | ICD-10-CM

## 2023-06-24 DIAGNOSIS — I700 Atherosclerosis of aorta: Secondary | ICD-10-CM | POA: Diagnosis not present

## 2023-06-24 DIAGNOSIS — R9389 Abnormal findings on diagnostic imaging of other specified body structures: Secondary | ICD-10-CM | POA: Insufficient documentation

## 2023-06-24 MED ORDER — GADOBUTROL 1 MMOL/ML IV SOLN
10.0000 mL | Freq: Once | INTRAVENOUS | Status: AC | PRN
  Administered 2023-06-24: 10 mL via INTRAVENOUS

## 2023-06-29 DIAGNOSIS — I10 Essential (primary) hypertension: Secondary | ICD-10-CM | POA: Diagnosis not present

## 2023-06-29 DIAGNOSIS — Z932 Ileostomy status: Secondary | ICD-10-CM | POA: Diagnosis not present

## 2023-06-29 DIAGNOSIS — Z6831 Body mass index (BMI) 31.0-31.9, adult: Secondary | ICD-10-CM | POA: Diagnosis not present

## 2023-06-29 DIAGNOSIS — F411 Generalized anxiety disorder: Secondary | ICD-10-CM | POA: Diagnosis not present

## 2023-06-29 DIAGNOSIS — Z8249 Family history of ischemic heart disease and other diseases of the circulatory system: Secondary | ICD-10-CM | POA: Diagnosis not present

## 2023-06-29 DIAGNOSIS — Z833 Family history of diabetes mellitus: Secondary | ICD-10-CM | POA: Diagnosis not present

## 2023-06-29 DIAGNOSIS — M792 Neuralgia and neuritis, unspecified: Secondary | ICD-10-CM | POA: Diagnosis not present

## 2023-06-29 DIAGNOSIS — H5360 Unspecified night blindness: Secondary | ICD-10-CM | POA: Diagnosis not present

## 2023-06-29 DIAGNOSIS — E669 Obesity, unspecified: Secondary | ICD-10-CM | POA: Diagnosis not present

## 2023-06-29 DIAGNOSIS — Z809 Family history of malignant neoplasm, unspecified: Secondary | ICD-10-CM | POA: Diagnosis not present

## 2023-06-29 DIAGNOSIS — J300 Vasomotor rhinitis: Secondary | ICD-10-CM | POA: Diagnosis not present

## 2023-06-29 DIAGNOSIS — Z933 Colostomy status: Secondary | ICD-10-CM | POA: Diagnosis not present

## 2023-07-10 ENCOUNTER — Inpatient Hospital Stay: Payer: 59 | Attending: Hematology | Admitting: Oncology

## 2023-07-10 DIAGNOSIS — Z800 Family history of malignant neoplasm of digestive organs: Secondary | ICD-10-CM | POA: Insufficient documentation

## 2023-07-10 DIAGNOSIS — R519 Headache, unspecified: Secondary | ICD-10-CM | POA: Insufficient documentation

## 2023-07-10 DIAGNOSIS — I10 Essential (primary) hypertension: Secondary | ICD-10-CM

## 2023-07-10 DIAGNOSIS — F17210 Nicotine dependence, cigarettes, uncomplicated: Secondary | ICD-10-CM | POA: Insufficient documentation

## 2023-07-10 DIAGNOSIS — Z8249 Family history of ischemic heart disease and other diseases of the circulatory system: Secondary | ICD-10-CM | POA: Insufficient documentation

## 2023-07-10 DIAGNOSIS — Z808 Family history of malignant neoplasm of other organs or systems: Secondary | ICD-10-CM | POA: Insufficient documentation

## 2023-07-10 DIAGNOSIS — Z8042 Family history of malignant neoplasm of prostate: Secondary | ICD-10-CM | POA: Insufficient documentation

## 2023-07-10 DIAGNOSIS — Z833 Family history of diabetes mellitus: Secondary | ICD-10-CM | POA: Insufficient documentation

## 2023-07-10 DIAGNOSIS — R5383 Other fatigue: Secondary | ICD-10-CM | POA: Insufficient documentation

## 2023-07-10 DIAGNOSIS — Z933 Colostomy status: Secondary | ICD-10-CM | POA: Insufficient documentation

## 2023-07-10 DIAGNOSIS — C20 Malignant neoplasm of rectum: Secondary | ICD-10-CM | POA: Diagnosis not present

## 2023-07-10 DIAGNOSIS — Z79899 Other long term (current) drug therapy: Secondary | ICD-10-CM | POA: Insufficient documentation

## 2023-07-10 DIAGNOSIS — D751 Secondary polycythemia: Secondary | ICD-10-CM | POA: Diagnosis not present

## 2023-07-10 DIAGNOSIS — I700 Atherosclerosis of aorta: Secondary | ICD-10-CM | POA: Insufficient documentation

## 2023-07-10 NOTE — Progress Notes (Unsigned)
[facility] [address], [place] [address]   Clinic Day:  07/10/2023  Referring physician: Clarene Critchley., MD  Patient Care Team: Default, Provider, MD as PCP - General  I connected with Tanner Dudley on 07/10/23 at  2:30 PM EST by telephone visit and verified that I am speaking with the correct person using two identifiers.   I discussed the limitations, risks, security and privacy concerns of performing an evaluation and management service by telemedicine and the availability of in-person appointments. I also discussed with the patient that there may be a patient responsible charge related to this service. The patient expressed understanding and agreed to proceed.   Other persons participating in the visit and their role in the encounter: NP, patient    Patient's location: Home   Provider's location: Clinic    ASSESSMENT & PLAN:   Assessment:  1.  Stage II rectal cancer: - Diagnosed in 2019, treated with TNT.  Received 8 cycles of neoadjuvant chemotherapy with FOLFOX followed by chemoradiation therapy with Xeloda. - S/p APR in December 2019 with end colostomy creation. - He was found to have a soft tissue recurrence in the right perineum in March 2023, for which she received FOLFIRI plus panitumumab for oligometastatic disease.  Tumor was negative for KRAS, NRAS and BRAF. - This was followed by curative/salvage cystoprostatectomy/penilectomy in August 2023.  He did have deep margin involvement in 2 areas as well as pubic bone biopsies which were positive for adenocarcinoma.  He had a long recovery time. - He is switched care to our cancer center as previous oncologist is no longer in the network.  2.  Social/family history: - He is independent of ADLs and IADLs.  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  He is a current active smoker, 1 pack/day started at age 79. - Brother had prostate cancer.  Another brother had skin  cancer.  Plan:  1.  Recurrent rectal cancer: - We discussed labs from from 06/09/2023 which showed a CEA of 2.3.  ALT is 59.  Rest of LFTs are grossly normal. - CT CAP (06/09/2023): Heterogeneous soft tissue nodularity along the pelvic floor, nonspecific.  New segment 4 hypodensity along the falciform ligament measuring 18 mm.  Differential includes focal fatty infiltration.  No evidence of metastatic disease within the chest. - Based on these findings, I have recommended further workup with MRI of the abdomen and pelvis. - We will do a phone visit after the above. - Otherwise I will see him back in 6 months with repeat CTAP with contrast, CEA and CBC and CMP.  2.  Hypertension: - I have started him on Norvasc 5 mg daily at last visit.  Blood pressure today is 130/89. - Will make referral to establish primary care.  3.  Erythrocytosis: - CBC on 06/03/2023 showed hemoglobin 19.3, hematocrit 57, RBC count 6.22.  All his previous values were within normal or in the anemic range.  We discussed causes of primary and secondary processes.  He is a current active 1 pack/day smoker. - We will repeat CBC today.  If hemoglobin continues to stay high, will consider workup including serum EPO level and JAK2 V617F with reflex testing.   No orders of the defined types were placed in this encounter.   Tanner Kaufmann, NP   12/6/20242:16 PM  CHIEF COMPLAINT/PURPOSE OF CONSULT:   Diagnosis: recurrent low rectal adenocarcinoma   Cancer Staging  No matching staging  information was found for the patient.    Prior Therapy: 1. Neoadjuvant FOLFOX, 11/03/17 - 03/03/18 2. Concurrent chemoradiation with Xeloda, 03/22/18 - 05/02/18 3. Abdominoperineal resection, 07/20/18 4. FOLFIRI and pamitumumab, 11/06/21 - 03/2022 5. Cystoprostatectomy, 03/21/22 6. Total penectomy, 05/01/22  Current Therapy:  surveillance   HISTORY OF PRESENT ILLNESS:   Oncology History   No history exists.    Tanner Dudley is a 57 y.o. male  presenting to clinic today for evaluation of recurrent low rectal adenocarcinoma at the request of Dr. Glenard Haring at [facility].  In summary, he initially presented in 08/2017 with a one-year history of rectal bleeding. Colonoscopy from 09/16/17 revealed a lesion in distal rectum, and biopsy confirmed invasive adenocarcinoma. Staging scans were negative for metastatic disease. He was treated with neoadjuvant chemotherapy with FOLFOX, followed by radiation therapy with concurrent capecitabine, followed by abdominoperineal resection. Pathology from definitive surgery showed two separate invasive rectal adenocarcinomas, measuring 3 cm and 0.8 cm and staged ypT3 and ypT1 respectively. All margins and 14 lymph nodes were negative. He was then placed on surveillance.  He was unfortunately found to have recurrent oligometastatic disease in right perineum on surveillance scan in 10/2021. He was treated with FOLFIRI with panitumumab, followed by cystoprostatectomy, and then total penectomy. He is now back on surveillance.  Today, he states that he is doing well overall. His appetite level is at 50%. His energy level is at 20%.  Went for sleep study and has sleep apnea but can't afford sleep machine.  Use to get phelbotomties when he was in [place].   INTERVAL HISTORY:   Tanner Dudley is a 57 y.o. male presenting to the clinic today for follow-up of recurrent low rectal adenocarcinoma. He was last seen by me on 06/03/23 in consultation.  Since his last visit, he underwent CT C/A/P on 06/09/23 that found: heterogenous soft tissue nodularity along the pelvic floor, is nonspecific possibly reflecting postsurgical change; new segment IV hypodensity along the falciform ligament measuring 18 mm is region which commonly reflects focal fatty infiltration or differential perfusion but is nonspecific; and no evidence of metastatic disease within the chest.  Today, he states that he is doing well overall. His appetite level is at 60%.  His energy level is at 40%. He has a tobacco use of 1 ppd. He notes he does not drink adequate amounts of water.   PAST MEDICAL HISTORY:   Past Medical History: Past Medical History:  Diagnosis Date   HOH (hard of hearing)    Medical history non-contributory    Rectal cancer [facility])     Surgical History: Past Surgical History:  Procedure Laterality Date   BIOPSY  09/16/2017   Procedure: BIOPSY;  Surgeon: Corbin Ade, MD;  Location: [facility];  Service: Gastroenterology;;  anal rectal   COLONOSCOPY WITH PROPOFOL N/A 09/16/2017   Procedure: COLONOSCOPY WITH PROPOFOL;  Surgeon: Corbin Ade, MD;  Location: [facility];  Service: Gastroenterology;  Laterality: N/A;   COLOSTOMY N/A 10/09/2017   Procedure: DIVERTING LOOP COLOSTOMY (PALLATIVE);  Surgeon: Franky Macho, MD;  Location: [facility] ORS;  Service: General;  Laterality: N/A;   None      Social History: Social History   Socioeconomic History   Marital status: Legally Separated    Spouse name: Not on file   Number of children: 0   Years of education: Not on file   Highest education level: 12th grade  Occupational History   Occupation: self employed  Tobacco Use   Smoking status:  Every Day    Current packs/day: 0.50    Average packs/day: 0.5 packs/day for 25.9 years (13.0 ttl pk-yrs)    Types: Cigarettes    Start date: 08/14/1997   Smokeless tobacco: Never  Vaping Use   Vaping status: Never Used  Substance and Sexual Activity   Alcohol use: Not Currently    Comment: 2-3 beers, 3-4 times per week    Drug use: No   Sexual activity: Not Currently  Other Topics Concern   Not on file  Social History Narrative   Separated for 6 weeks as of 09/24/17   No biological children   Social Determinants of Health   Financial Resource Strain: Low Risk  (07/18/2022)   Received from [facility]   Overall Financial Resource Strain (CARDIA)    Difficulty of Paying Living Expenses: Not very hard  Food Insecurity: No Food  Insecurity (06/03/2023)   Hunger Vital Sign    Worried About Running Out of Food in the Last Year: Never true    Ran Out of Food in the Last Year: Never true  Transportation Needs: No Transportation Needs (06/03/2023)   PRAPARE - Administrator, Civil Service (Medical): No    Lack of Transportation (Non-Medical): No  Physical Activity: Inactive (10/15/2020)   Received from [facility], [facility]   Exercise Vital Sign    Days of Exercise per Week: 0 days    Minutes of Exercise per Session: 0 min  Stress: No Stress Concern Present (10/15/2021)   Received from [facility], [facility] of Occupational Health - Occupational Stress Questionnaire    Feeling of Stress : Not at all  Social Connections: Moderately Isolated (10/02/2021)   Received from [facility], [facility]   Social Connection and Isolation Panel [NHANES]    Frequency of Communication with Friends and Family: More than three times a week    Frequency of Social Gatherings with Friends and Family: Never    Attends Religious Services: Never    Database administrator or Organizations: No    Attends Banker Meetings: Never    Marital Status: Married  Catering manager Violence: Not At Risk (06/03/2023)   Humiliation, Afraid, Rape, and Kick questionnaire    Fear of Current or Ex-Partner: No    Emotionally Abused: No    Physically Abused: No    Sexually Abused: No    Family History: Family History  Problem Relation Age of Onset   Colon cancer Father        diagnosed in his 66s, succumbed to disease   Diabetes Mother    Heart attack Mother    Hypertension Mother    Pancreatic cancer Sister    Prostate cancer Brother    Melanoma Brother    Inflammatory bowel disease Neg Hx    Colon polyps Neg Hx     Current Medications:  Current Outpatient Medications:    amLODipine (NORVASC) 5 MG tablet, Take 1 tablet (5 mg total) by mouth daily., Disp: 30 tablet,  Rfl: 1   atorvastatin (LIPITOR) 20 MG tablet, Take 20 mg by mouth at bedtime., Disp: , Rfl:    azelastine (ASTELIN) 0.1 % nasal spray, Place 1 spray into both nostrils in the morning., Disp: , Rfl:    cyanocobalamin (VITAMIN B12) 1000 MCG tablet, Take 1 tablet by mouth daily., Disp: , Rfl:    diphenoxylate-atropine (LOMOTIL) 2.5-0.025 MG tablet, Take 1 tablet by  mouth 4 (four) times daily as needed., Disp: , Rfl:    escitalopram (LEXAPRO) 20 MG tablet, Take 1 tablet (20 mg total) by mouth daily., Disp: 30 tablet, Rfl: 1   ondansetron (ZOFRAN) 8 MG tablet, Take 8 mg by mouth every 8 (eight) hours as needed., Disp: , Rfl:    potassium chloride (KLOR-CON) 10 MEQ tablet, Take 10 mEq by mouth daily., Disp: , Rfl:    promethazine-dextromethorphan (PROMETHAZINE-DM) 6.25-15 MG/5ML syrup, TAKE 5 MILLILITERS BY MOUTH 4 TIMES A DAY AS NEEDED FOR COUGH FOR UP TO 7 DAYS, Disp: , Rfl:    Allergies: No Known Allergies  REVIEW OF SYSTEMS:   Review of Systems  Constitutional:  Negative for chills, fatigue and fever.  HENT:   Negative for lump/mass, mouth sores, nosebleeds, sore throat and trouble swallowing.   Eyes:  Negative for eye problems.  Respiratory:  Positive for shortness of breath. Negative for cough.   Cardiovascular:  Negative for chest pain, leg swelling and palpitations.  Gastrointestinal:  Negative for abdominal pain, constipation, diarrhea, nausea and vomiting.  Genitourinary:  Negative for bladder incontinence, difficulty urinating, dysuria, frequency, hematuria and nocturia.   Musculoskeletal:  Negative for arthralgias, back pain, flank pain, myalgias and neck pain.  Skin:  Negative for itching and rash.  Neurological:  Negative for dizziness, headaches and numbness.  Hematological:  Does not bruise/bleed easily.  Psychiatric/Behavioral:  Negative for depression, sleep disturbance and suicidal ideas. The patient is nervous/anxious.   All other systems reviewed and are negative.     VITALS:   There were no vitals taken for this visit.  Wt Readings from Last 3 Encounters:  06/16/23 199 lb 3.2 oz (90.4 kg)  06/03/23 203 lb 6.4 oz (92.3 kg)  07/30/20 210 lb (95.3 kg)    There is no height or weight on file to calculate BMI.  Performance status (ECOG): 1 - Symptomatic but completely ambulatory  PHYSICAL EXAM:   Physical Exam Vitals and nursing note reviewed. Exam conducted with a chaperone present.  Constitutional:      Appearance: Normal appearance.  Cardiovascular:     Rate and Rhythm: Normal rate and regular rhythm.     Pulses: Normal pulses.     Heart sounds: Normal heart sounds.  Pulmonary:     Effort: Pulmonary effort is normal.     Breath sounds: Normal breath sounds.  Abdominal:     Palpations: Abdomen is soft. There is no hepatomegaly, splenomegaly or mass.     Tenderness: There is no abdominal tenderness.  Musculoskeletal:     Right lower leg: No edema.     Left lower leg: No edema.  Lymphadenopathy:     Cervical: No cervical adenopathy.     Right cervical: No superficial, deep or posterior cervical adenopathy.    Left cervical: No superficial, deep or posterior cervical adenopathy.     Upper Body:     Right upper body: No supraclavicular or axillary adenopathy.     Left upper body: No supraclavicular or axillary adenopathy.  Neurological:     General: No focal deficit present.     Mental Status: He is alert and oriented to person, place, and time.  Psychiatric:        Mood and Affect: Mood normal.        Behavior: Behavior normal.   Left-sided colostomy.  LABS:      Latest Ref Rng & Units 06/16/2023   11:04 AM 06/03/2023    1:55 PM 10/11/2017  6:48 AM  CBC  WBC 4.0 - 10.5 K/uL 4.8  4.5  7.3   Hemoglobin 13.0 - 17.0 g/dL 13.0  86.5  78.4   Hematocrit 39.0 - 52.0 % 55.5  56.9  31.1   Platelets 150 - 400 K/uL 305  278  455       Latest Ref Rng & Units 06/03/2023    1:55 PM 10/11/2017    6:48 AM 10/10/2017    4:21 AM  CMP   Glucose 70 - 99 mg/dL 97  89  95   BUN 6 - 20 mg/dL 9  8  8    Creatinine 0.61 - 1.24 mg/dL 6.96  2.95  2.84   Sodium 135 - 145 mmol/L 137  140  140   Potassium 3.5 - 5.1 mmol/L 4.0  3.8  4.1   Chloride 98 - 111 mmol/L 101  106  105   CO2 22 - 32 mmol/L 24  27  25    Calcium 8.9 - 10.3 mg/dL 9.4  8.5  8.7   Total Protein 6.5 - 8.1 g/dL 8.1     Total Bilirubin 0.3 - 1.2 mg/dL 1.3     Alkaline Phos 38 - 126 U/L 53     AST 15 - 41 U/L 40     ALT 0 - 44 U/L 59        Lab Results  Component Value Date   CEA1 2.3 06/03/2023   /  CEA  Date Value Ref Range Status  06/03/2023 2.3 0.0 - 4.7 ng/mL Final    Comment:    (NOTE)                             Nonsmokers          <3.9                             Smokers             <5.6 Roche [employer] Electrochemiluminescence Immunoassay (ECLIA) Values obtained with different assay methods or kits cannot be used interchangeably.  Results cannot be interpreted as absolute evidence of the presence or absence of malignant disease. Performed At: [facility] [address] [place], [place] [id number] Jolene Schimke MD VO:[id number]    No results found for: "PSA1" No results found for: "[id number]" No results found for: "CAN125"  No results found for: "TOTALPROTELP", "ALBUMINELP", "A1GS", "A2GS", "BETS", "BETA2SER", "GAMS", "MSPIKE", "SPEI" Lab Results  Component Value Date   FERRITIN 16 (L) 09/24/2017   Lab Results  Component Value Date   LDH 120 09/24/2017     STUDIES:   MR Abdomen W Wo Contrast  Result Date: 06/25/2023 CLINICAL DATA:  History of rectal cancer, characterize suspected focal fatty deposition adjacent to the falciform ligament queried by prior CT EXAM: MRI ABDOMEN WITHOUT AND WITH CONTRAST TECHNIQUE: Multiplanar multisequence MR imaging of the abdomen was performed both before and after the administration of intravenous contrast. CONTRAST:  10mL GADAVIST GADOBUTROL 1 MMOL/ML IV SOLN COMPARISON:  CT chest  abdomen pelvis, 06/09/2023 FINDINGS: Lower chest: No acute abnormality. Hepatobiliary: No solid liver abnormality is seen. Focal fatty deposition adjacent to the falciform ligament, as seen by prior CT (series 8, image 19). No suspicious liver lesions or contrast enhancement. No gallstones, gallbladder wall thickening, or biliary dilatation. Pancreas: Unremarkable. No pancreatic ductal dilatation or surrounding inflammatory changes. Spleen: Normal in size without  significant abnormality. Adrenals/Urinary Tract: Adrenal glands are unremarkable. Kidneys are normal, without renal calculi, solid lesion, or hydronephrosis. Stomach/Bowel: Stomach is within normal limits. No evidence of bowel wall thickening, distention, or inflammatory changes. Vascular/Lymphatic: Aortic atherosclerosis. No enlarged abdominal lymph nodes. Other: No abdominal wall hernia. Partially imaged right lower quadrant ostomy (series 11, image 96) no ascites. Musculoskeletal: No acute or significant osseous findings. IMPRESSION: 1. Focal fatty deposition adjacent to the falciform ligament, as seen by prior CT characteristic in location and appearance. No suspicious liver lesions or contrast enhancement. 2. No evidence of lymphadenopathy or metastatic disease in the abdomen. Electronically Signed   By: Jearld Lesch M.D.   On: 06/25/2023 21:51

## 2023-07-14 ENCOUNTER — Inpatient Hospital Stay: Payer: 59

## 2023-07-14 ENCOUNTER — Other Ambulatory Visit: Payer: Self-pay

## 2023-07-14 ENCOUNTER — Encounter: Payer: Self-pay | Admitting: *Deleted

## 2023-07-14 DIAGNOSIS — D751 Secondary polycythemia: Secondary | ICD-10-CM | POA: Diagnosis not present

## 2023-07-14 DIAGNOSIS — Z800 Family history of malignant neoplasm of digestive organs: Secondary | ICD-10-CM | POA: Diagnosis not present

## 2023-07-14 DIAGNOSIS — Z833 Family history of diabetes mellitus: Secondary | ICD-10-CM | POA: Diagnosis not present

## 2023-07-14 DIAGNOSIS — Z8042 Family history of malignant neoplasm of prostate: Secondary | ICD-10-CM | POA: Diagnosis not present

## 2023-07-14 DIAGNOSIS — F17210 Nicotine dependence, cigarettes, uncomplicated: Secondary | ICD-10-CM | POA: Diagnosis not present

## 2023-07-14 DIAGNOSIS — C20 Malignant neoplasm of rectum: Secondary | ICD-10-CM | POA: Diagnosis not present

## 2023-07-14 DIAGNOSIS — Z808 Family history of malignant neoplasm of other organs or systems: Secondary | ICD-10-CM | POA: Diagnosis not present

## 2023-07-14 DIAGNOSIS — Z933 Colostomy status: Secondary | ICD-10-CM | POA: Diagnosis not present

## 2023-07-14 DIAGNOSIS — Z8249 Family history of ischemic heart disease and other diseases of the circulatory system: Secondary | ICD-10-CM | POA: Diagnosis not present

## 2023-07-14 DIAGNOSIS — R5383 Other fatigue: Secondary | ICD-10-CM | POA: Diagnosis not present

## 2023-07-14 DIAGNOSIS — I700 Atherosclerosis of aorta: Secondary | ICD-10-CM | POA: Diagnosis not present

## 2023-07-14 DIAGNOSIS — Z79899 Other long term (current) drug therapy: Secondary | ICD-10-CM | POA: Diagnosis not present

## 2023-07-14 DIAGNOSIS — I10 Essential (primary) hypertension: Secondary | ICD-10-CM | POA: Diagnosis not present

## 2023-07-14 DIAGNOSIS — R519 Headache, unspecified: Secondary | ICD-10-CM | POA: Diagnosis not present

## 2023-07-14 LAB — CBC
HCT: 54.2 % — ABNORMAL HIGH (ref 39.0–52.0)
Hemoglobin: 17.9 g/dL — ABNORMAL HIGH (ref 13.0–17.0)
MCH: 30 pg (ref 26.0–34.0)
MCHC: 33 g/dL (ref 30.0–36.0)
MCV: 90.8 fL (ref 80.0–100.0)
Platelets: 273 10*3/uL (ref 150–400)
RBC: 5.97 MIL/uL — ABNORMAL HIGH (ref 4.22–5.81)
RDW: 13.9 % (ref 11.5–15.5)
WBC: 4.8 10*3/uL (ref 4.0–10.5)
nRBC: 0 % (ref 0.0–0.2)

## 2023-07-14 NOTE — Progress Notes (Signed)
Patient presents today for therapeutic phlebotomy.  Patient is in satisfactory condition with no new complaints voiced.  Vital signs are stable.  We will proceed with phlebotomy per provider orders.  Therapeutic phlebotomy started at 1445 and ended at 1521.  460 mL of blood removed from L sided chest port.  Vital signs remained stable.  Patient denies dizziness or lightheadedness.  Patient refused to wait the recommended 30 minute post phlebotomy wait time.  Vital signs remained stable.  Patient left ambulatory in stable condition.

## 2023-07-14 NOTE — Patient Instructions (Signed)

## 2023-08-03 ENCOUNTER — Other Ambulatory Visit: Payer: Self-pay | Admitting: Hematology

## 2023-08-06 ENCOUNTER — Encounter: Payer: Self-pay | Admitting: Hematology

## 2023-08-25 ENCOUNTER — Other Ambulatory Visit: Payer: Self-pay | Admitting: Hematology

## 2023-08-27 ENCOUNTER — Encounter: Payer: Self-pay | Admitting: Hematology

## 2023-09-02 ENCOUNTER — Other Ambulatory Visit: Payer: Self-pay

## 2023-09-02 DIAGNOSIS — C20 Malignant neoplasm of rectum: Secondary | ICD-10-CM

## 2023-09-02 DIAGNOSIS — D751 Secondary polycythemia: Secondary | ICD-10-CM

## 2023-09-03 ENCOUNTER — Encounter: Payer: Self-pay | Admitting: Hematology

## 2023-09-03 ENCOUNTER — Inpatient Hospital Stay: Payer: 59 | Attending: Hematology

## 2023-09-03 DIAGNOSIS — D751 Secondary polycythemia: Secondary | ICD-10-CM | POA: Diagnosis not present

## 2023-09-03 DIAGNOSIS — C20 Malignant neoplasm of rectum: Secondary | ICD-10-CM | POA: Diagnosis not present

## 2023-09-03 LAB — CBC WITH DIFFERENTIAL/PLATELET
Abs Immature Granulocytes: 0.01 10*3/uL (ref 0.00–0.07)
Basophils Absolute: 0.1 10*3/uL (ref 0.0–0.1)
Basophils Relative: 2 %
Eosinophils Absolute: 0.1 10*3/uL (ref 0.0–0.5)
Eosinophils Relative: 3 %
HCT: 53.3 % — ABNORMAL HIGH (ref 39.0–52.0)
Hemoglobin: 17.5 g/dL — ABNORMAL HIGH (ref 13.0–17.0)
Immature Granulocytes: 0 %
Lymphocytes Relative: 43 %
Lymphs Abs: 2.1 10*3/uL (ref 0.7–4.0)
MCH: 31 pg (ref 26.0–34.0)
MCHC: 32.8 g/dL (ref 30.0–36.0)
MCV: 94.5 fL (ref 80.0–100.0)
Monocytes Absolute: 0.6 10*3/uL (ref 0.1–1.0)
Monocytes Relative: 13 %
Neutro Abs: 1.9 10*3/uL (ref 1.7–7.7)
Neutrophils Relative %: 39 %
Platelets: 247 10*3/uL (ref 150–400)
RBC: 5.64 MIL/uL (ref 4.22–5.81)
RDW: 14.8 % (ref 11.5–15.5)
WBC: 4.9 10*3/uL (ref 4.0–10.5)
nRBC: 0 % (ref 0.0–0.2)

## 2023-09-03 LAB — COMPREHENSIVE METABOLIC PANEL
ALT: 57 U/L — ABNORMAL HIGH (ref 0–44)
AST: 40 U/L (ref 15–41)
Albumin: 4.2 g/dL (ref 3.5–5.0)
Alkaline Phosphatase: 57 U/L (ref 38–126)
Anion gap: 10 (ref 5–15)
BUN: 18 mg/dL (ref 6–20)
CO2: 23 mmol/L (ref 22–32)
Calcium: 9.4 mg/dL (ref 8.9–10.3)
Chloride: 102 mmol/L (ref 98–111)
Creatinine, Ser: 1 mg/dL (ref 0.61–1.24)
GFR, Estimated: >60 mL/min (ref >60)
Glucose, Bld: 88 mg/dL (ref 70–99)
Potassium: 4.1 mmol/L (ref 3.5–5.1)
Sodium: 135 mmol/L (ref 135–145)
Total Bilirubin: 0.8 mg/dL (ref 0.0–1.2)
Total Protein: 8 g/dL (ref 6.5–8.1)

## 2023-09-10 ENCOUNTER — Inpatient Hospital Stay: Payer: 59 | Attending: Hematology | Admitting: Oncology

## 2023-09-10 ENCOUNTER — Encounter: Payer: Self-pay | Admitting: Hematology

## 2023-09-10 ENCOUNTER — Encounter: Payer: Self-pay | Admitting: Oncology

## 2023-09-10 DIAGNOSIS — C20 Malignant neoplasm of rectum: Secondary | ICD-10-CM

## 2023-09-10 DIAGNOSIS — D751 Secondary polycythemia: Secondary | ICD-10-CM | POA: Diagnosis not present

## 2023-09-10 NOTE — Progress Notes (Signed)
 [facility] [address], [place] [address]   Clinic Day:  09/10/2023  Referring physician: Letty Mertie Dudley., MD  Patient Care Team: Default, Provider, MD as PCP - General  I connected with Tanner Dudley on 09/10/23 at  1:00 PM EST by telephone visit and verified that I am speaking with the correct person using two identifiers.   I discussed the limitations, risks, security and privacy concerns of performing an evaluation and management service by telemedicine and the availability of in-person appointments. I also discussed with the patient that there may be a patient responsible charge related to this service. The patient expressed understanding and agreed to proceed.   Other persons participating in the visit and their role in the encounter: NP, patient    Patient's location: Home   Provider's location: Clinic    ASSESSMENT & PLAN:   Assessment:  1.  Stage II rectal cancer: - Diagnosed in 2019, treated with TNT.  Received 8 cycles of neoadjuvant chemotherapy with FOLFOX followed by chemoradiation therapy with Xeloda . - S/p APR in December 2019 with end colostomy creation. - He was found to have a soft tissue recurrence in the right perineum in March 2023, for which she received FOLFIRI plus panitumumab for oligometastatic disease.  Tumor was negative for KRAS, NRAS and BRAF. - This was followed by curative/salvage cystoprostatectomy/penilectomy in August 2023.  He did have deep margin involvement in 2 areas as well as pubic bone biopsies which were positive for adenocarcinoma.  He had a long recovery time. - He is switched care to our cancer center as previous oncologist is no longer in the network.  2.  Social/family history: - He is independent of ADLs and IADLs.  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  He is a current active smoker, 1 pack/day started at age 7. - Brother had prostate cancer.  Another brother had skin  cancer.  Plan:  1.  Recurrent rectal cancer: - Most recent CT scan CAP from 06/09/2023 showed heterogeneous soft tissue nodularity along the pelvic floor that is nonspecific.  New segment 4 hypodensity along the falciform ligament measuring 18 mm.  Differential includes fatty infiltration.  No evidence of metastatic disease within the chest. -MRI of abdomen/pelvis from 06/24/2023 shows T2 hypointense area in the perineum which is favored to represent postsurgical changes/fibrotic tissue. -He will follow-up in 6 months with repeat CTAP with contrast, CEA and CBC and CMP.  CT CAP is scheduled for 12/09/2023.  2.  Secondary erythrocytosis: - Workup showed negative JAK2 testing with reflex.  Erythropoietin  level is 6.0. -Discussed secondary erythrocytosis in detail and management and elevated hemoglobin likely secondary to cigarette smoking.  We discussed phlebotomies if hematocrit is greater than 54 and/or he is experiencing symptoms of hyperviscosity. -Most recent labs from 09/03/2023 which showed a hemoglobin of 17.5 with hematocrit of 53.3.  -He had 500 mL phlebotomy on 07/13/2024 with improvement of his fatigue and headaches. -Will hold off on additional phlebotomies at this time.  Patient to have labs drawn in approximately 6 weeks.  He knows to call clinic sooner if he develops a headache or significant fatigue.  I provided 14 minutes of non face-to-face telephone visit time during this encounter, and > 50% was spent counseling as documented under my assessment & plan.   No orders of the defined types were placed in this encounter.   Tanner FORBES Hope, NP   2/6/202510:15 AM  CHIEF COMPLAINT/PURPOSE OF CONSULT:  Diagnosis: recurrent low rectal adenocarcinoma   Cancer Staging  No matching staging information was found for the patient.    Prior Therapy: 1. Neoadjuvant FOLFOX, 11/03/17 - 03/03/18 2. Concurrent chemoradiation with Xeloda , 03/22/18 - 05/02/18 3. Abdominoperineal resection,  07/20/18 4. FOLFIRI and pamitumumab, 11/06/21 - 03/2022 5. Cystoprostatectomy, 03/21/22 6. Total penectomy, 05/01/22  Current Therapy:  surveillance  HISTORY OF PRESENT ILLNESS:   Oncology History   No history exists.    [name] is a 58 y.o. male presenting to clinic today for evaluation of recurrent low rectal adenocarcinoma.  In summary, he initially presented in 08/2017 with a one-year history of rectal bleeding. Colonoscopy from 09/16/17 revealed a lesion in distal rectum, and biopsy confirmed invasive adenocarcinoma. Staging scans were negative for metastatic disease. He was treated with neoadjuvant chemotherapy with FOLFOX, followed by radiation therapy with concurrent capecitabine , followed by abdominoperineal resection. Pathology from definitive surgery showed two separate invasive rectal adenocarcinomas, measuring 3 cm and 0.8 cm and staged ypT3 and ypT1 respectively. All margins and 14 lymph nodes were negative. He was then placed on surveillance.  He was unfortunately found to have recurrent oligometastatic disease in right perineum on surveillance scan in 10/2021. He was treated with FOLFIRI with panitumumab, followed by cystoprostatectomy, and then total penectomy. He is now back on surveillance.    INTERVAL HISTORY:   Tanner Dudley is a 58 y.o. male presenting to the clinic today for follow-up of recurrent low rectal adenocarcinoma. He was last seen by me on 07/10/2023.  He underwent CT C/A/P on 06/09/23 that found: heterogenous soft tissue nodularity along the pelvic floor, is nonspecific possibly reflecting postsurgical change; new segment IV hypodensity along the falciform ligament measuring 18 mm is region which commonly reflects focal fatty infiltration or differential perfusion but is nonspecific; and no evidence of metastatic disease within the chest.  He was referred for an MRI of his abdomen.  MRI from 06/24/2023 showed T2 hypodense area in the perineum which is favored to  represent postsurgical changes fibrotic tissue.  Focal fatty deposition adjacent to follicle on ligament without suspicious liver lesions.  No evidence of lymphadenopathy or metastatic disease in the abdomen.  He is scheduled for a repeat CT in May 2025.  Patient had 500 mL phlebotomy on 07/14/2023 with great tolerance.  Reports fatigue and headaches improved significantly following the phlebotomy.  He is currently feeling well and denies any hyperviscosity symptoms.  Appetite and energy levels are stable.  He continues to smoke 1 pack of cigarettes per day.   PAST MEDICAL HISTORY:   Past Medical History: Past Medical History:  Diagnosis Date   HOH (hard of hearing)    Medical history non-contributory    Rectal cancer [facility])     Surgical History: Past Surgical History:  Procedure Laterality Date   BIOPSY  09/16/2017   Procedure: BIOPSY;  Surgeon: Shaaron Lamar HERO, MD;  Location: [facility];  Service: Gastroenterology;;  anal rectal   COLONOSCOPY WITH PROPOFOL  N/A 09/16/2017   Procedure: COLONOSCOPY WITH PROPOFOL ;  Surgeon: Shaaron Lamar HERO, MD;  Location: [facility];  Service: Gastroenterology;  Laterality: N/A;   COLOSTOMY N/A 10/09/2017   Procedure: DIVERTING LOOP COLOSTOMY (PALLATIVE);  Surgeon: [name], MD;  Location: [facility] ORS;  Service: General;  Laterality: N/A;   None      Social History: Social History   Socioeconomic History   Marital status: Legally Separated    Spouse name: Not on file   Number of children: 0  Years of education: Not on file   Highest education level: 12th grade  Occupational History   Occupation: self employed  Tobacco Use   Smoking status: Every Day    Current packs/day: 0.50    Average packs/day: 0.5 packs/day for 26.1 years (13.0 ttl pk-yrs)    Types: Cigarettes    Start date: 08/14/1997   Smokeless tobacco: Never  Vaping Use   Vaping status: Never Used  Substance and Sexual Activity   Alcohol use: Not Currently    Comment: 2-3  beers, 3-4 times per week    Drug use: No   Sexual activity: Not Currently  Other Topics Concern   Not on file  Social History Narrative   Separated for 6 weeks as of 09/24/17   No biological children   Social Drivers of Corporate Investment Banker Strain: Low Risk  (07/18/2022)   [facility]   Overall Financial Resource Strain (CARDIA)    Difficulty of Paying Living Expenses: Not very hard  Food Insecurity: No Food Insecurity (06/03/2023)   Hunger Vital Sign    Worried About Running Out of Food in the Last Year: Never true    Ran Out of Food in the Last Year: Never true  Transportation Needs: No Transportation Needs (06/03/2023)   PRAPARE - Administrator, Civil Service (Medical): No    Lack of Transportation (Non-Medical): No  Physical Activity: Inactive (10/15/2020)   [facility]   Exercise Vital Sign    Days of Exercise per Week: 0 days    Minutes of Exercise per Session: 0 min  Stress: No Stress Concern Present (10/15/2021)   Received from [facility], [facility] of Occupational Health - Occupational Stress Questionnaire    Feeling of Stress : Not at all  Social Connections: Moderately Isolated (10/02/2021)   Received from [facility]   Social Connection and Isolation Panel [NHANES]    Frequency of Communication with Friends and Family: More than three times a week    Frequency of Social Gatherings with Friends and Family: Never    Attends Religious Services: Never    Database Administrator or Organizations: No    Attends Banker Meetings: Never    Marital Status: Married  Catering Manager Violence: Not At Risk (06/03/2023)   Humiliation, Afraid, Rape, and Kick questionnaire    Fear of Current or Ex-Partner: No    Emotionally Abused: No    Physically Abused: No    Sexually Abused: No    Family History: Family History  Problem Relation Age of  Onset   Colon cancer Father        diagnosed in his 61s, succumbed to disease   Diabetes Mother    Heart attack Mother    Hypertension Mother    Pancreatic cancer Sister    Prostate cancer Brother    Melanoma Brother    Inflammatory bowel disease Neg Hx    Colon polyps Neg Hx     Current Medications:  Current Outpatient Medications:    amLODipine  (NORVASC ) 5 MG tablet, Take 1 tablet (5 mg total) by mouth daily., Disp: 30 tablet, Rfl: 1   atorvastatin (LIPITOR) 20 MG tablet, Take 20 mg by mouth at bedtime., Disp: , Rfl:    azelastine (ASTELIN) 0.1 % nasal spray, Place 1 spray into both nostrils in the morning., Disp: , Rfl:  cyanocobalamin (VITAMIN B12) 1000 MCG tablet, Take 1 tablet by mouth daily., Disp: , Rfl:    diphenoxylate-atropine (LOMOTIL) 2.5-0.025 MG tablet, Take 1 tablet by mouth 4 (four) times daily as needed., Disp: , Rfl:    escitalopram  (LEXAPRO ) 20 MG tablet, TAKE 1 TABLET BY MOUTH EVERY DAY, Disp: 90 tablet, Rfl: 1   ondansetron  (ZOFRAN ) 8 MG tablet, Take 8 mg by mouth every 8 (eight) hours as needed., Disp: , Rfl:    potassium chloride  (KLOR-CON ) 10 MEQ tablet, Take 10 mEq by mouth daily., Disp: , Rfl:    promethazine-dextromethorphan (PROMETHAZINE-DM) 6.25-15 MG/5ML syrup, TAKE 5 MILLILITERS BY MOUTH 4 TIMES A DAY AS NEEDED FOR COUGH FOR UP TO 7 DAYS, Disp: , Rfl:    Allergies: No Known Allergies  REVIEW OF SYSTEMS:   Review of Systems  Constitutional:  Negative for fatigue.  Respiratory:  Negative for shortness of breath.   Gastrointestinal:  Negative for abdominal pain, blood in stool, constipation, diarrhea and nausea.  Neurological:  Negative for dizziness and headaches.     VITALS:   There were no vitals taken for this visit.  Wt Readings from Last 3 Encounters:  06/16/23 199 lb 3.2 oz (90.4 kg)  06/03/23 203 lb 6.4 oz (92.3 kg)  07/30/20 210 lb (95.3 kg)    There is no height or weight on file to calculate BMI.  Performance status (ECOG): 1 -  Symptomatic but completely ambulatory  PHYSICAL EXAM:   Physical Exam Neurological:     Mental Status: He is alert and oriented to person, place, and time.    LABS:      Latest Ref Rng & Units 09/03/2023    1:05 PM 07/14/2023    1:26 PM 06/16/2023   11:04 AM  CBC  WBC 4.0 - 10.5 K/uL 4.9  4.8  4.8   Hemoglobin 13.0 - 17.0 g/dL 82.4  82.0  80.9   Hematocrit 39.0 - 52.0 % 53.3  54.2  55.5   Platelets 150 - 400 K/uL 247  273  305       Latest Ref Rng & Units 09/03/2023    1:05 PM 06/03/2023    1:55 PM 10/11/2017    6:48 AM  CMP  Glucose 70 - 99 mg/dL 88  97  89   BUN 6 - 20 mg/dL 18  9  8    Creatinine 0.61 - 1.24 mg/dL 8.99  8.82  9.25   Sodium 135 - 145 mmol/L 135  137  140   Potassium 3.5 - 5.1 mmol/L 4.1  4.0  3.8   Chloride 98 - 111 mmol/L 102  101  106   CO2 22 - 32 mmol/L 23  24  27    Calcium 8.9 - 10.3 mg/dL 9.4  9.4  8.5   Total Protein 6.5 - 8.1 g/dL 8.0  8.1    Total Bilirubin 0.0 - 1.2 mg/dL 0.8  1.3    Alkaline Phos 38 - 126 U/L 57  53    AST 15 - 41 U/L 40  40    ALT 0 - 44 U/L 57  59       Lab Results  Component Value Date   CEA1 2.3 06/03/2023   /  CEA  Date Value Ref Range Status  06/03/2023 2.3 0.0 - 4.7 ng/mL Final    Comment:    (NOTE)  Nonsmokers          <3.9                             Smokers             <5.6 Roche Diagnostics Electrochemiluminescence Immunoassay (ECLIA) Values obtained with different assay methods or kits cannot be used interchangeably.  Results cannot be interpreted as absolute evidence of the presence or absence of malignant disease. Performed At: [facility] [address] [place], [place] [id number] Jennette Shorter MD Ey:[phone]    No results found for: PSA1 No results found for: CAN199 No results found for: CAN125  No results found for: TOTALPROTELP, ALBUMINELP, A1GS, A2GS, BETS, BETA2SER, GAMS, MSPIKE, SPEI Lab Results  Component Value Date    FERRITIN 16 (L) 09/24/2017   Lab Results  Component Value Date   LDH 120 09/24/2017     STUDIES:   No results found.

## 2023-10-15 ENCOUNTER — Encounter: Payer: Self-pay | Admitting: Hematology

## 2023-10-22 ENCOUNTER — Inpatient Hospital Stay: Payer: Self-pay | Attending: Hematology

## 2023-10-22 ENCOUNTER — Encounter: Payer: Self-pay | Admitting: Hematology

## 2023-10-22 DIAGNOSIS — C20 Malignant neoplasm of rectum: Secondary | ICD-10-CM | POA: Insufficient documentation

## 2023-10-22 LAB — CBC WITH DIFFERENTIAL/PLATELET
Abs Immature Granulocytes: 0.02 10*3/uL (ref 0.00–0.07)
Basophils Absolute: 0.1 10*3/uL (ref 0.0–0.1)
Basophils Relative: 2 %
Eosinophils Absolute: 0.1 10*3/uL (ref 0.0–0.5)
Eosinophils Relative: 2 %
HCT: 55.6 % — ABNORMAL HIGH (ref 39.0–52.0)
Hemoglobin: 18.6 g/dL — ABNORMAL HIGH (ref 13.0–17.0)
Immature Granulocytes: 0 %
Lymphocytes Relative: 44 %
Lymphs Abs: 2.7 10*3/uL (ref 0.7–4.0)
MCH: 31.1 pg (ref 26.0–34.0)
MCHC: 33.5 g/dL (ref 30.0–36.0)
MCV: 92.8 fL (ref 80.0–100.0)
Monocytes Absolute: 1 10*3/uL (ref 0.1–1.0)
Monocytes Relative: 16 %
Neutro Abs: 2.2 10*3/uL (ref 1.7–7.7)
Neutrophils Relative %: 36 %
Platelets: 228 10*3/uL (ref 150–400)
RBC: 5.99 MIL/uL — ABNORMAL HIGH (ref 4.22–5.81)
RDW: 13 % (ref 11.5–15.5)
Smear Review: ADEQUATE
WBC: 6.1 10*3/uL (ref 4.0–10.5)
nRBC: 0 % (ref 0.0–0.2)

## 2023-10-23 ENCOUNTER — Encounter: Payer: Self-pay | Admitting: Hematology

## 2023-10-29 ENCOUNTER — Encounter: Payer: Self-pay | Admitting: Hematology

## 2023-10-29 ENCOUNTER — Inpatient Hospital Stay: Payer: Self-pay

## 2023-10-29 DIAGNOSIS — C20 Malignant neoplasm of rectum: Secondary | ICD-10-CM | POA: Diagnosis not present

## 2023-10-29 MED ORDER — SODIUM CHLORIDE 0.9% FLUSH
10.0000 mL | INTRAVENOUS | Status: AC
  Administered 2023-10-29: 10 mL

## 2023-10-29 MED ORDER — HEPARIN SOD (PORK) LOCK FLUSH 100 UNIT/ML IV SOLN
500.0000 [IU] | Freq: Once | INTRAVENOUS | Status: AC
  Administered 2023-10-29: 500 [IU] via INTRAVENOUS

## 2023-10-29 NOTE — Patient Instructions (Signed)
 [facility] [place] - A DEPT OF [facility]  Discharge Instructions: Thank you for choosing [facility] to provide your oncology and hematology care.  If you have a lab appointment with the Cancer Center - please note that after April 8th, 2024, all labs will be drawn in the cancer center.  You do not have to check in or register with the main entrance as you have in the past but will complete your check-in in the cancer center.  Wear comfortable clothing and clothing appropriate for easy access to any Portacath or PICC line.   We strive to give you quality time with your provider. You may need to reschedule your appointment if you arrive late (15 or more minutes).  Arriving late affects you and other patients whose appointments are after yours.  Also, if you miss three or more appointments without notifying the office, you may be dismissed from the clinic at the provider's discretion.      For prescription refill requests, have your pharmacy contact our office and allow 72 hours for refills to be completed.    Today you received the following chemotherapy and/or immunotherapy agents phlebotomy. Therapeutic Phlebotomy, Care After The following information offers guidance on how to care for yourself after your procedure. Your health care provider may also give you more specific instructions. If you have problems or questions, contact your health care provider. What can I expect after the procedure? After therapeutic phlebotomy, it is common to have: Light-headedness or dizziness. You may feel faint. Nausea. Tiredness (fatigue). Follow these instructions at home: Eating and drinking Be sure to eat well-balanced meals for the next 24 hours. Drink enough fluid to keep your urine pale yellow. Avoid drinking alcohol on the day that you had the procedure. Activity  Return to your normal activities as told by your health care provider. Most people can go back  to their normal activities right away. Avoid activities that take a lot of effort for about 5 hours after the procedure. Athletes should avoid strenuous exercise for at least 12 hours. Avoid heavy lifting or pulling for about 5 hours after the procedure. Do not lift anything that is heavier than 10 lb (4.5 kg). Change positions slowly for the remainder of the day, like from sitting to standing. This can help prevent light-headedness or fainting. If you feel light-headed, lie down until the feeling goes away. Needle insertion site care  Keep your bandage (dressing) dry. You can remove the bandage after about 5 hours or as told by your health care provider. If you have bleeding from the needle insertion site, raise (elevate) your arm and press firmly on the site until the bleeding stops. If you have bruising at the site, apply ice to the area. To do this: Put ice in a plastic bag. Place a towel between your skin and the bag. Leave the ice on for 20 minutes, 2-3 times a day for the first 24 hours. Remove the ice if your skin turns bright red so you do not damage the area. If the swelling does not go away after 24 hours, apply a warm, moist cloth (warm compress) to the area for 20 minutes, 2-3 times a day. General instructions Do not use any products that contain nicotine or tobacco, like cigarettes, chewing tobacco, and vaping devices, such as e-cigarettes, for at least 30 minutes after the procedure. If you need help quitting, ask your health care provider. Keep all follow-up visits.  You may need to continue having regular blood tests and therapeutic phlebotomy treatments as directed. Contact a health care provider if: You have redness, swelling, or pain at the needle insertion site. Fluid or blood is coming from the needle insertion site. Pus or a bad smell is coming from the needle insertion site. The needle insertion site feels warm to the touch. You feel light-headed, dizzy, or nauseous, and  the feeling does not go away. You have new bruising at the needle insertion site. You feel weaker than normal. You have a fever or chills. Get help right away if: You have chest pain. You have trouble breathing. You have severe nausea or vomiting. Summary After the procedure, it is common to have some light-headedness, dizziness, nausea, or tiredness (fatigue). Be sure to eat well-balanced meals for the next 24 hours. Drink enough fluid to keep your urine pale yellow. Return to your normal activities as told by your health care provider. Keep all follow-up visits. You may need to continue having regular blood tests and therapeutic phlebotomy treatments as directed. This information is not intended to replace advice given to you by your health care provider. Make sure you discuss any questions you have with your health care provider. Document Revised: 01/06/2023 Document Reviewed: 01/16/2021 Elsevier Patient Education  2024 Elsevier Inc.      To help prevent nausea and vomiting after your treatment, we encourage you to take your nausea medication as directed.  BELOW ARE SYMPTOMS THAT SHOULD BE REPORTED IMMEDIATELY: *FEVER GREATER THAN 100.4 F (38 C) OR HIGHER *CHILLS OR SWEATING *NAUSEA AND VOMITING THAT IS NOT CONTROLLED WITH YOUR NAUSEA MEDICATION *UNUSUAL SHORTNESS OF BREATH *UNUSUAL BRUISING OR BLEEDING *URINARY PROBLEMS (pain or burning when urinating, or frequent urination) *BOWEL PROBLEMS (unusual diarrhea, constipation, pain near the anus) TENDERNESS IN MOUTH AND THROAT WITH OR WITHOUT PRESENCE OF ULCERS (sore throat, sores in mouth, or a toothache) UNUSUAL RASH, SWELLING OR PAIN  UNUSUAL VAGINAL DISCHARGE OR ITCHING   Items with * indicate a potential emergency and should be followed up as soon as possible or go to the Emergency Department if any problems should occur.  Please show the CHEMOTHERAPY ALERT CARD or IMMUNOTHERAPY ALERT CARD at check-in to the Emergency  Department and triage nurse.  Should you have questions after your visit or need to cancel or reschedule your appointment, please contact [facility] [place] - A DEPT OF [facility] [phone]  and follow the prompts.  Office hours are 8:00 a.m. to 4:30 p.m. Monday - Friday. Please note that voicemails left after 4:00 p.m. may not be returned until the following business day.  We are closed weekends and major holidays. You have access to a nurse at all times for urgent questions. Please call the main number to the clinic [phone] and follow the prompts.  For any non-urgent questions, you may also contact your provider using MyChart. We now offer e-Visits for anyone 28 and older to request care online for non-urgent symptoms. For details visit [email or web address].   Also download the MyChart app! Go to the app store, search "MyChart", open the app, select [place], and log in with your MyChart username and password.

## 2023-10-29 NOTE — Progress Notes (Signed)
 Sota Klecka presents for therapeutic phlebotomy per MD orders. Blood pressure elevated diastolic on arrival. Patient states he did not take his blood pressure medications today. Patient has not had any breakfast or lunch today per patient's words. Last HGB 18.6 / HCT 55.6 on 10-22-2023 . Vital signs stable prior to procedure. Procedure started at 15:50 pm and ended at 1650. 500 mls of blood removed.   Message sent to Dr. Ellin Saba and scheduling to make a note that patient has a port and phlebotomy will be done by port and patient needs to be scheduled for port flushes q 3 months.   Patient denies any dizziness ,lightheadedness, or feeling faint. Gauze and coban applied to site. Vital signs stable at completion of procedure. Patient has no complaints at this time. Alert and oriented x 3. Discharged in stable condition.

## 2023-10-29 NOTE — Progress Notes (Signed)
 Patient's phlebotomy was finished at 1650. Discharged in satisfactory condition with follow up instructions.

## 2023-11-05 ENCOUNTER — Other Ambulatory Visit: Payer: Self-pay | Admitting: Hematology

## 2023-11-08 ENCOUNTER — Other Ambulatory Visit: Payer: Self-pay | Admitting: Hematology

## 2023-11-09 ENCOUNTER — Encounter: Payer: Self-pay | Admitting: Hematology

## 2023-11-16 ENCOUNTER — Ambulatory Visit: Payer: Self-pay

## 2023-11-16 VITALS — BP 142/92 | HR 84 | Ht 68.0 in | Wt 209.1 lb

## 2023-11-16 DIAGNOSIS — H6993 Unspecified Eustachian tube disorder, bilateral: Secondary | ICD-10-CM

## 2023-11-16 DIAGNOSIS — H6123 Impacted cerumen, bilateral: Secondary | ICD-10-CM | POA: Diagnosis not present

## 2023-11-16 NOTE — Patient Instructions (Signed)
 Follow-up in 3 months for recheck of blood pressure and to do labs.

## 2023-11-16 NOTE — Progress Notes (Signed)
 New Patient Office Visit  Subjective    Patient ID: Tanner Dudley, male    DOB: 07-05-1966  Age: 58 y.o. MRN: [id number]  CC:  Chief Complaint  Patient presents with   Establish Care    Pt. States "he's fine at home but if he goes to the grocery store he feels woozy"    HPI Tanner Dudley presents to establish care   Outpatient Encounter Medications as of 11/16/2023  Medication Sig   amLODipine (NORVASC) 5 MG tablet TAKE 1 TABLET BY MOUTH EVERY DAY   escitalopram (LEXAPRO) 20 MG tablet TAKE 1 TABLET BY MOUTH EVERY DAY   [DISCONTINUED] atorvastatin (LIPITOR) 20 MG tablet Take 20 mg by mouth at bedtime. (Patient not taking: Reported on 11/16/2023)   [DISCONTINUED] azelastine (ASTELIN) 0.1 % nasal spray Place 1 spray into both nostrils in the morning. (Patient not taking: Reported on 11/16/2023)   [DISCONTINUED] cyanocobalamin (VITAMIN B12) 1000 MCG tablet Take 1 tablet by mouth daily. (Patient not taking: Reported on 11/16/2023)   [DISCONTINUED] diphenoxylate-atropine (LOMOTIL) 2.5-0.025 MG tablet Take 1 tablet by mouth 4 (four) times daily as needed. (Patient not taking: Reported on 11/16/2023)   [DISCONTINUED] ondansetron (ZOFRAN) 8 MG tablet Take 8 mg by mouth every 8 (eight) hours as needed. (Patient not taking: Reported on 11/16/2023)   [DISCONTINUED] potassium chloride (KLOR-CON) 10 MEQ tablet Take 10 mEq by mouth daily. (Patient not taking: Reported on 11/16/2023)   [DISCONTINUED] promethazine-dextromethorphan (PROMETHAZINE-DM) 6.25-15 MG/5ML syrup TAKE 5 MILLILITERS BY MOUTH 4 TIMES A DAY AS NEEDED FOR COUGH FOR UP TO 7 DAYS (Patient not taking: Reported on 11/16/2023)   No facility-administered encounter medications on file as of 11/16/2023.    Past Medical History:  Diagnosis Date   Allergy    Depression    HOH (hard of hearing)    Medical history non-contributory    Rectal cancer [facility])     Past Surgical History:  Procedure Laterality Date   BIOPSY  09/16/2017   Procedure:  BIOPSY;  Surgeon: Corbin Ade, MD;  Location[facility];  Service: Gastroenterology;;  anal rectal   COLONOSCOPY WITH PROPOFOL N/A 09/16/2017   Procedure: COLONOSCOPY WITH PROPOFOL;  Surgeon: Corbin Ade, MD;  Location: [facility];  Service: Gastroenterology;  Laterality: N/A;   COLOSTOMY N/A 10/09/2017   Procedure: DIVERTING LOOP COLOSTOMY (PALLATIVE);  Surgeon: Franky Macho, MD;  Location: [facility];  Service: General;  Laterality: N/A;   None      Family History  Problem Relation Age of Onset   Colon cancer Father        diagnosed in his 51s, succumbed to disease   Diabetes Mother    Heart attack Mother    Hypertension Mother    Pancreatic cancer Sister    Prostate cancer Brother    Melanoma Brother    Inflammatory bowel disease Neg Hx    Colon polyps Neg Hx     Social History   Socioeconomic History   Marital status: Legally Separated    Spouse name: Not on file   Number of children: 0   Years of education: Not on file   Highest education level: 12th grade  Occupational History   Occupation: self employed  Tobacco Use   Smoking status: Every Day    Current packs/day: 0.50    Average packs/day: 0.5 packs/day for 26.3 years (13.1 ttl pk-yrs)    Types: Cigarettes    Start date: 08/14/1997   Smokeless tobacco: Never  Vaping Use  Vaping status: Never Used  Substance and Sexual Activity   Alcohol use: Not Currently    Comment: 2-3 beers, 3-4 times per week    Drug use: No   Sexual activity: Not Currently  Other Topics Concern   Not on file  Social History Narrative   Separated for 6 weeks as of 09/24/17   No biological children   Social Drivers of Corporate investment banker Strain: Low Risk  (07/18/2022)   Received from [facility]   Overall Financial Resource Strain (CARDIA)    Difficulty of Paying Living Expenses: Not very hard  Food Insecurity: No Food Insecurity (11/16/2023)   Hunger Vital Sign    Worried About Running Out of Food in the Last  Year: Never true    Ran Out of Food in the Last Year: Never true  Transportation Needs: No Transportation Needs (11/16/2023)   PRAPARE - Administrator, Civil Service (Medical): No    Lack of Transportation (Non-Medical): No  Physical Activity: Inactive (10/15/2020)   Received from [facility], [facility]   Exercise Vital Sign    Days of Exercise per Week: 0 days    Minutes of Exercise per Session: 0 min  Stress: No Stress Concern Present (10/15/2021)   Received from [facility], [facility] of Occupational Health - Occupational Stress Questionnaire    Feeling of Stress : Not at all  Social Connections: Moderately Isolated (10/02/2021)   Received from [facility], [facility]   Social Connection and Isolation Panel [NHANES]    Frequency of Communication with Friends and Family: More than three times a week    Frequency of Social Gatherings with Friends and Family: Never    Attends Religious Services: Never    Database administrator or Organizations: No    Attends Banker Meetings: Never    Marital Status: Married  Catering manager Violence: Not At Risk (11/16/2023)   Humiliation, Afraid, Rape, and Kick questionnaire    Fear of Current or Ex-Partner: No    Emotionally Abused: No    Physically Abused: No    Sexually Abused: No    ROS      Objective    BP (!) 142/92   Pulse 84   Ht 5\' 8"  (1.727 m)   Wt 209 lb 1.3 oz (94.8 kg)   SpO2 91%   BMI 31.79 kg/m   Physical Exam Vitals and nursing note reviewed.  Constitutional:      Appearance: Normal appearance.  HENT:     Head: Normocephalic.     Right Ear: Tympanic membrane, ear canal and external ear normal. Decreased hearing noted. There is impacted cerumen.     Left Ear: Tympanic membrane, ear canal and external ear normal. Decreased hearing noted. There is impacted cerumen.     Nose: No congestion or rhinorrhea.     Right Turbinates: Enlarged.     Left  Turbinates: Enlarged.     Right Sinus: No maxillary sinus tenderness or frontal sinus tenderness.     Left Sinus: No maxillary sinus tenderness or frontal sinus tenderness.     Mouth/Throat:     Mouth: Mucous membranes are moist.     Pharynx: Oropharynx is clear.  Eyes:     Extraocular Movements: Extraocular movements intact.     Pupils: Pupils are equal, round, and reactive to light.  Cardiovascular:     Rate and Rhythm: Normal  rate and regular rhythm.     Pulses:          Carotid pulses are 2+ on the right side and 2+ on the left side.    Heart sounds: Normal heart sounds, S1 normal and S2 normal.  Pulmonary:     Effort: Pulmonary effort is normal.     Breath sounds: Normal breath sounds.  Musculoskeletal:     Right lower leg: No edema.     Left lower leg: No edema.  Skin:    General: Skin is warm and dry.  Neurological:     Mental Status: He is alert and oriented to person, place, and time.  Psychiatric:        Mood and Affect: Mood normal.        Thought Content: Thought content normal.         Assessment & Plan:   Problem List Items Addressed This Visit   None Visit Diagnoses       Eustachian tube dysfunction, bilateral    -  Primary   recommend restarting OTC Claritin or Zyrtec for allergy relief.  also recommend adding Flonase as well.     Bilateral impacted cerumen       unable to remove cerumen in office with curette.  recommend OTC Debrox or hydrogen peroxide to help with cerumen removal.  recommend ENT consult if no better.       No follow-ups on file.   Alison Irvine, FNP

## 2023-12-09 ENCOUNTER — Other Ambulatory Visit: Payer: 59

## 2023-12-09 ENCOUNTER — Ambulatory Visit (HOSPITAL_COMMUNITY): Payer: 59

## 2023-12-09 ENCOUNTER — Encounter: Payer: Self-pay | Admitting: Hematology

## 2023-12-09 ENCOUNTER — Inpatient Hospital Stay: Payer: 59 | Attending: Hematology

## 2023-12-09 DIAGNOSIS — Z933 Colostomy status: Secondary | ICD-10-CM | POA: Insufficient documentation

## 2023-12-09 DIAGNOSIS — I10 Essential (primary) hypertension: Secondary | ICD-10-CM | POA: Insufficient documentation

## 2023-12-09 DIAGNOSIS — Z9221 Personal history of antineoplastic chemotherapy: Secondary | ICD-10-CM | POA: Diagnosis not present

## 2023-12-09 DIAGNOSIS — F17210 Nicotine dependence, cigarettes, uncomplicated: Secondary | ICD-10-CM | POA: Diagnosis not present

## 2023-12-09 DIAGNOSIS — C20 Malignant neoplasm of rectum: Secondary | ICD-10-CM | POA: Diagnosis present

## 2023-12-09 DIAGNOSIS — Z923 Personal history of irradiation: Secondary | ICD-10-CM | POA: Insufficient documentation

## 2023-12-09 DIAGNOSIS — Z79899 Other long term (current) drug therapy: Secondary | ICD-10-CM | POA: Diagnosis not present

## 2023-12-09 LAB — COMPREHENSIVE METABOLIC PANEL WITH GFR
ALT: 21 U/L (ref 0–44)
AST: 22 U/L (ref 15–41)
Albumin: 4.1 g/dL (ref 3.5–5.0)
Alkaline Phosphatase: 56 U/L (ref 38–126)
Anion gap: 11 (ref 5–15)
BUN: 8 mg/dL (ref 6–20)
CO2: 24 mmol/L (ref 22–32)
Calcium: 9.5 mg/dL (ref 8.9–10.3)
Chloride: 101 mmol/L (ref 98–111)
Creatinine, Ser: 0.99 mg/dL (ref 0.61–1.24)
GFR, Estimated: >60 mL/min (ref >60)
Glucose, Bld: 82 mg/dL (ref 70–99)
Potassium: 3.5 mmol/L (ref 3.5–5.1)
Sodium: 136 mmol/L (ref 135–145)
Total Bilirubin: 0.9 mg/dL (ref 0.0–1.2)
Total Protein: 7.8 g/dL (ref 6.5–8.1)

## 2023-12-09 LAB — CBC WITH DIFFERENTIAL/PLATELET
Abs Immature Granulocytes: 0.01 10*3/uL (ref 0.00–0.07)
Basophils Absolute: 0.1 10*3/uL (ref 0.0–0.1)
Basophils Relative: 2 %
Eosinophils Absolute: 0.2 10*3/uL (ref 0.0–0.5)
Eosinophils Relative: 3 %
HCT: 50.5 % (ref 39.0–52.0)
Hemoglobin: 17.2 g/dL — ABNORMAL HIGH (ref 13.0–17.0)
Immature Granulocytes: 0 %
Lymphocytes Relative: 43 %
Lymphs Abs: 2.3 10*3/uL (ref 0.7–4.0)
MCH: 31.1 pg (ref 26.0–34.0)
MCHC: 34.1 g/dL (ref 30.0–36.0)
MCV: 91.3 fL (ref 80.0–100.0)
Monocytes Absolute: 0.7 10*3/uL (ref 0.1–1.0)
Monocytes Relative: 13 %
Neutro Abs: 2.1 10*3/uL (ref 1.7–7.7)
Neutrophils Relative %: 39 %
Platelets: 247 10*3/uL (ref 150–400)
RBC: 5.53 MIL/uL (ref 4.22–5.81)
RDW: 13.4 % (ref 11.5–15.5)
WBC: 5.3 10*3/uL (ref 4.0–10.5)
nRBC: 0 % (ref 0.0–0.2)

## 2023-12-10 LAB — CEA: CEA: 3.3 ng/mL (ref 0.0–4.7)

## 2023-12-15 ENCOUNTER — Encounter: Payer: Self-pay | Admitting: Hematology

## 2023-12-16 ENCOUNTER — Inpatient Hospital Stay

## 2023-12-16 ENCOUNTER — Inpatient Hospital Stay (HOSPITAL_BASED_OUTPATIENT_CLINIC_OR_DEPARTMENT_OTHER): Payer: 59 | Admitting: Hematology

## 2023-12-16 VITALS — BP 137/100 | HR 88 | Temp 98.6°F | Resp 18 | Wt 205.0 lb

## 2023-12-16 DIAGNOSIS — I10 Essential (primary) hypertension: Secondary | ICD-10-CM

## 2023-12-16 DIAGNOSIS — Z933 Colostomy status: Secondary | ICD-10-CM

## 2023-12-16 DIAGNOSIS — Z79899 Other long term (current) drug therapy: Secondary | ICD-10-CM | POA: Diagnosis not present

## 2023-12-16 DIAGNOSIS — C20 Malignant neoplasm of rectum: Secondary | ICD-10-CM | POA: Diagnosis not present

## 2023-12-16 DIAGNOSIS — F17210 Nicotine dependence, cigarettes, uncomplicated: Secondary | ICD-10-CM | POA: Diagnosis not present

## 2023-12-16 DIAGNOSIS — Z923 Personal history of irradiation: Secondary | ICD-10-CM

## 2023-12-16 DIAGNOSIS — Z9221 Personal history of antineoplastic chemotherapy: Secondary | ICD-10-CM

## 2023-12-16 MED ORDER — HEPARIN SOD (PORK) LOCK FLUSH 100 UNIT/ML IV SOLN: 500.0000 [IU] | Freq: Once | INTRAVENOUS | Status: DC

## 2023-12-16 MED ORDER — SODIUM CHLORIDE 0.9% FLUSH: 10.0000 mL | Freq: Once | INTRAVENOUS | Status: DC

## 2023-12-16 NOTE — Progress Notes (Signed)
 Pt left prior to port flush appt. Treatment team made aware. Port flush rescheduled.   Verlon Pischke [employer]

## 2023-12-16 NOTE — Patient Instructions (Addendum)
 [facility] at [facility] Discharge Instructions   You were seen and examined today by Dr. Cheree Cords.  He reviewed the results of your lab work which are normal/stable.   He reviewed the results of your CT scan that was done at [facility] does not show any evidence of cancer.   We will see you back in 6 months. We will repeat lab work prior to this next visit.   Return as scheduled.    Thank you for choosing [facility] at [facility] to provide your oncology and hematology care.  To afford each patient quality time with our provider, please arrive at least 15 minutes before your scheduled appointment time.   If you have a lab appointment with the Cancer Center please come in thru the Main Entrance and check in at the main information desk.  You need to re-schedule your appointment should you arrive 10 or more minutes late.  We strive to give you quality time with our providers, and arriving late affects you and other patients whose appointments are after yours.  Also, if you no show three or more times for appointments you may be dismissed from the clinic at the providers discretion.     Again, thank you for choosing [facility].  Our hope is that these requests will decrease the amount of time that you wait before being seen by our physicians.       _____________________________________________________________  Should you have questions after your visit to [facility], please contact our office at [phone] and follow the prompts.  Our office hours are 8:00 a.m. and 4:30 p.m. Monday - Friday.  Please note that voicemails left after 4:00 p.m. may not be returned until the following business day.  We are closed weekends and major holidays.  You do have access to a nurse 24-7, just call the main number to the clinic [phone] and do not press any options, hold on the line and a nurse will answer the phone.    For  prescription refill requests, have your pharmacy contact our office and allow 72 hours.    Due to Covid, you will need to wear a mask upon entering the hospital. If you do not have a mask, a mask will be given to you at the Main Entrance upon arrival. For doctor visits, patients may have 1 support person age 34 or older with them. For treatment visits, patients can not have anyone with them due to social distancing guidelines and our immunocompromised population.

## 2023-12-16 NOTE — Progress Notes (Signed)
 Tanner Dudley [address] [place] [address]   Clinic Day:  12/16/2023  Referring physician: Maylene Dudley., MD  Patient Care Team: Tanner Irvine, FNP as PCP - General (Family Medicine)   ASSESSMENT & PLAN:   Assessment:  1.  Stage II rectal cancer: - Diagnosed in 2019, treated with TNT.  Received 8 cycles of neoadjuvant chemotherapy with FOLFOX followed by chemoradiation therapy with Xeloda . - S/p APR in December 2019 with end colostomy creation. - He was found to have a soft tissue recurrence in the right perineum in March 2023, for which she received FOLFIRI plus panitumumab for oligometastatic disease.  Tumor was negative for KRAS, NRAS and BRAF. - This was followed by curative/salvage cystoprostatectomy/penilectomy in August 2023.  He did have deep margin involvement in 2 areas as well as pubic bone biopsies which were positive for adenocarcinoma.  He had a long recovery time. - He is switched care to our cancer [facility] as previous oncologist is no longer in the network.  2.  Social/family history: - He is independent of ADLs and IADLs.  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  He is a current active smoker, 1 pack/day started at age 65. - Brother had prostate cancer.  Another brother had skin cancer.  Plan:  1.  Recurrent rectal cancer: - He denies any change in bowel habits.  He denies any bleeding per rectum or melena. - Labs on 12/09/2023: Normal LFTs.  CBC grossly normal.  CEA is 3.3. - Reviewed CTAP from 12/10/2023 done at [facility]: Postsurgical changes with no evidence of recurrence or metastatic disease. - He is 2 years post surgery.  Moving forward, we will do imaging once a year.  RTC 6 months for follow-up with repeat labs including CEA.  2.  Hypertension: - He is on Norvasc  5 mg daily.  Blood pressure is 130/100.  He reports occasional dizziness.  I have recommended that he check his blood pressure at home and  call us  or his PMD.  If it continues to be high, we may have to increase Norvasc  dose.  3.  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  Thought to be secondary to smoking.  He had phlebotomy done on 07/14/2023 with slightly improved energy levels.  He had another phlebotomy on 10/29/2023, but did not feel better after the second time.  CBC on 12/09/2023 shows hemoglobin 17.2 and hematocrit 50.5.  No indication for phlebotomy at this time.   Orders Placed This Encounter  Procedures   CBC with Differential    Standing Status:   Future    Expected Date:   06/13/2024    Expiration Date:   12/15/2024   Comprehensive metabolic panel    Standing Status:   Future    Expected Date:   06/13/2024    Expiration Date:   12/15/2024   CEA    Standing Status:   Future    Expected Date:   06/13/2024    Expiration Date:   12/15/2024      Tanner Dudley,acting as a scribe for Tanner Boros, MD.,have documented all relevant documentation on the behalf of Tanner Boros, MD,as directed by  Tanner Boros, MD while in the presence of Tanner Boros, MD.  I, Tanner Boros MD, have reviewed the above documentation for accuracy and completeness, and I agree with the above.     Tanner Boros, MD   5/14/20254:13  PM  CHIEF COMPLAINT/PURPOSE OF CONSULT:   Diagnosis: recurrent low rectal adenocarcinoma   Cancer Staging  No matching staging information was found for the patient.    Prior Therapy: 1. Neoadjuvant FOLFOX, 11/03/17 - 03/03/18 2. Concurrent chemoradiation with Xeloda , 03/22/18 - 05/02/18 3. Abdominoperineal resection, 07/20/18 4. FOLFIRI and pamitumumab, 11/06/21 - 03/2022 5. Cystoprostatectomy, 03/21/22 6. Total penectomy, 05/01/22  Current Therapy:  surveillance   HISTORY OF PRESENT ILLNESS:   Oncology History   No history exists.      Tanner Dudley is a 58 y.o. male presenting to clinic today for evaluation of  recurrent low rectal adenocarcinoma at the request of Tanner [facility] at [facility].  In summary, he initially presented in 08/2017 with a one-year history of rectal bleeding. Colonoscopy from 09/16/17 revealed a lesion in distal rectum, and biopsy confirmed invasive adenocarcinoma. Staging scans were negative for metastatic disease. He was treated with neoadjuvant chemotherapy with FOLFOX, followed by radiation therapy with concurrent capecitabine , followed by abdominoperineal resection. Pathology from definitive surgery showed two separate invasive rectal adenocarcinomas, measuring 3 cm and 0.8 cm and staged ypT3 and ypT1 respectively. All margins and 14 lymph nodes were negative. He was then placed on surveillance.  He was unfortunately found to have recurrent oligometastatic disease in right perineum on surveillance scan in 10/2021. He was treated with FOLFIRI with panitumumab, followed by cystoprostatectomy, and then total penectomy. He is now back on surveillance.  Today, he states that he is doing well overall. His appetite level is at 50%. His energy level is at 20%.  INTERVAL HISTORY:   Tanner Dudley is a 58 y.o. male presenting to the clinic today for follow-up of recurrent low rectal adenocarcinoma. He was last seen by me on 06/16/23 and Tanner Dudley on 09/10/23.   Today, he states that he is doing well overall. His appetite level is at 100%. His energy level is at 75%.   PAST MEDICAL HISTORY:   Past Medical History: Past Medical History:  Diagnosis Date   Allergy    Depression    HOH (hard of hearing)    Medical history non-contributory    Rectal cancer [facility])     Surgical History: Past Surgical History:  Procedure Laterality Date   BIOPSY  09/16/2017   Procedure: BIOPSY;  Surgeon: Tanner Espy, MD;  [facility];  Service: Gastroenterology;;  anal rectal   COLONOSCOPY WITH PROPOFOL  N/A 09/16/2017   Procedure: COLONOSCOPY WITH PROPOFOL ;  Surgeon: Tanner Espy, MD;  Location[facility];  Service: Gastroenterology;  Laterality: N/A;   COLOSTOMY N/A 10/09/2017   Procedure: DIVERTING LOOP COLOSTOMY (PALLATIVE);  Surgeon: Tanner Allegra, MD;  Location: [facility];  Service: General;  Laterality: N/A;   None      Social History: Social History   Socioeconomic History   Marital status: Legally Separated    Spouse name: Not on file   Number of children: 0   Years of education: Not on file   Highest education level: 12th grade  Occupational History   Occupation: self employed  Tobacco Use   Smoking status: Every Day    Current packs/day: 0.50    Average packs/day: 0.5 packs/day for 26.3 years (13.2 ttl pk-yrs)    Types: Cigarettes    Start date: 08/14/1997   Smokeless tobacco: Never  Vaping Use   Vaping status: Never Used  Substance and Sexual Activity   Alcohol use: Not Currently    Comment: 2-3 beers, 3-4 times per week  Drug use: No   Sexual activity: Not Currently  Other Topics Concern   Not on file  Social History Narrative   Separated for 6 weeks as of 09/24/17   No biological children   Social Drivers of Health   Financial Resource Strain: Low Risk  (07/18/2022)   [facility]   Overall Financial Resource Strain (CARDIA)    Difficulty of Paying Living Expenses: Not very hard  Food Insecurity: No Food Insecurity (11/16/2023)   Hunger Vital Sign    Worried About Running Out of Food in the Last Year: Never true    Ran Out of Food in the Last Year: Never true  Transportation Needs: No Transportation Needs (11/16/2023)   PRAPARE - Administrator, Civil Service (Medical): No    Lack of Transportation (Non-Medical): No  Physical Activity: Inactive (10/15/2020)   [facility], [facility]   Exercise Vital Sign    Days of Exercise per Week: 0 days    Minutes of Exercise per Session: 0 min  Stress: No Stress Concern Present (10/15/2021)   Received from [facility], [facility] of Occupational Health - Occupational Stress Questionnaire    Feeling of Stress : Not at all  Social Connections: Moderately Isolated (10/02/2021)   Received from [facility], [facility]   Social Connection and Isolation Panel [NHANES]    Frequency of Communication with Friends and Family: More than three times a week    Frequency of Social Gatherings with Friends and Family: Never    Attends Religious Services: Never    Database administrator or Organizations: No    Attends Banker Meetings: Never    Marital Status: Married  Catering manager Violence: Not At Risk (11/16/2023)   Humiliation, Afraid, Rape, and Kick questionnaire    Fear of Current or Ex-Partner: No    Emotionally Abused: No    Physically Abused: No    Sexually Abused: No    Family History: Family History  Problem Relation Age of Onset   Colon cancer Father        diagnosed in his 45s, succumbed to disease   Diabetes Mother    Heart attack Mother    Hypertension Mother    Pancreatic cancer Sister    Prostate cancer Brother    Melanoma Brother    Inflammatory bowel disease Neg Hx    Colon polyps Neg Hx     Current Medications:  Current Outpatient Medications:    amLODipine  (NORVASC ) 5 MG tablet, TAKE 1 TABLET BY MOUTH EVERY DAY, Disp: 30 tablet, Rfl: 5   escitalopram  (LEXAPRO ) 20 MG tablet, TAKE 1 TABLET BY MOUTH EVERY DAY, Disp: 90 tablet, Rfl: 2 No current facility-administered medications for this visit.  Facility-Administered Medications Ordered in Other Visits:    heparin  lock flush 100 unit/mL, 500 Units, Intravenous, Once, Tanner Boros, MD   sodium chloride  flush (NS) 0.9 % injection 10 mL, 10 mL, Intravenous, Once, Tanner Boros, MD   Allergies: No Known Allergies  REVIEW OF SYSTEMS:   Review of Systems  Constitutional:  Negative for chills, fatigue and fever.  HENT:   Negative for lump/mass, mouth sores, nosebleeds, sore throat and trouble  swallowing.   Eyes:  Negative for eye problems.  Respiratory:  Negative for cough and shortness of breath.   Cardiovascular:  Negative for chest pain, leg swelling and palpitations.  Gastrointestinal:  Negative for  abdominal pain, constipation, diarrhea, nausea and vomiting.  Genitourinary:  Negative for bladder incontinence, difficulty urinating, dysuria, frequency, hematuria and nocturia.   Musculoskeletal:  Negative for arthralgias, back pain, flank pain, myalgias and neck pain.  Skin:  Negative for itching and rash.  Neurological:  Positive for dizziness. Negative for headaches and numbness.  Hematological:  Does not bruise/bleed easily.  Psychiatric/Behavioral:  Positive for sleep disturbance. Negative for depression and suicidal ideas. The patient is not nervous/anxious.   All other systems reviewed and are negative.    VITALS:   Blood pressure (!) 137/100, pulse 88, temperature 98.6 F (37 C), temperature source Oral, resp. rate 18, weight 205 lb 0.4 oz (93 kg), SpO2 96%.  Wt Readings from Last 3 Encounters:  12/16/23 205 lb 0.4 oz (93 kg)  11/16/23 209 lb 1.3 oz (94.8 kg)  06/16/23 199 lb 3.2 oz (90.4 kg)    Body mass index is 31.17 kg/m.  Performance status (ECOG): 1 - Symptomatic but completely ambulatory  PHYSICAL EXAM:   Physical Exam Vitals and nursing note reviewed. Exam conducted with a chaperone present.  Constitutional:      Appearance: Normal appearance.  Cardiovascular:     Rate and Rhythm: Normal rate and regular rhythm.     Pulses: Normal pulses.     Heart sounds: Normal heart sounds.  Pulmonary:     Effort: Pulmonary effort is normal.     Breath sounds: Normal breath sounds.  Abdominal:     Palpations: Abdomen is soft. There is no hepatomegaly, splenomegaly or mass.     Tenderness: There is no abdominal tenderness.  Musculoskeletal:     Right lower leg: No edema.     Left lower leg: No edema.  Lymphadenopathy:     Cervical: No cervical adenopathy.      Right cervical: No superficial, deep or posterior cervical adenopathy.    Left cervical: No superficial, deep or posterior cervical adenopathy.     Upper Body:     Right upper body: No supraclavicular or axillary adenopathy.     Left upper body: No supraclavicular or axillary adenopathy.  Neurological:     General: No focal deficit present.     Mental Status: He is alert and oriented to person, place, and time.  Psychiatric:        Mood and Affect: Mood normal.        Behavior: Behavior normal.   Left-sided colostomy.  LABS:      Latest Ref Rng & Units 12/09/2023   12:05 PM 10/22/2023   10:47 AM 09/03/2023    1:05 PM  CBC  WBC 4.0 - 10.5 K/uL 5.3  6.1  4.9   Hemoglobin 13.0 - 17.0 g/dL 16.1  09.6  04.5   Hematocrit 39.0 - 52.0 % 50.5  55.6  53.3   Platelets 150 - 400 K/uL 247  228  247       Latest Ref Rng & Units 12/09/2023   12:05 PM 09/03/2023    1:05 PM 06/03/2023    1:55 PM  CMP  Glucose 70 - 99 mg/dL 82  88  97   BUN 6 - 20 mg/dL 8  18  9    Creatinine 0.61 - 1.24 mg/dL 4.09  8.11  9.14   Sodium 135 - 145 mmol/L 136  135  137   Potassium 3.5 - 5.1 mmol/L 3.5  4.1  4.0   Chloride 98 - 111 mmol/L 101  102  101   CO2 22 -  32 mmol/L 24  23  24    Calcium 8.9 - 10.3 mg/dL 9.5  9.4  9.4   Total Protein 6.5 - 8.1 g/dL 7.8  8.0  8.1   Total Bilirubin 0.0 - 1.2 mg/dL 0.9  0.8  1.3   Alkaline Phos 38 - 126 U/L 56  57  53   AST 15 - 41 U/L 22  40  40   ALT 0 - 44 U/L 21  57  59      Lab Results  Component Value Date   CEA1 3.3 12/09/2023   /  CEA  Date Value Ref Range Status  12/09/2023 3.3 0.0 - 4.7 ng/mL Final    Comment:    (NOTE)                             Nonsmokers          <3.9                             Smokers             <5.6 Roche [employer] Electrochemiluminescence Immunoassay (ECLIA) Values obtained with different assay methods or kits cannot be used interchangeably.  Results cannot be interpreted as absolute evidence of the presence or absence  of malignant disease. Performed At: [facility] [address] [place], [place] [id number] Pearlean Botts MD NW:[id number]    No results found for: "PSA1" No results found for: "CAN199" No results found for: "CAN125"  No results found for: "TOTALPROTELP", "ALBUMINELP", "A1GS", "A2GS", "BETS", "BETA2SER", "GAMS", "MSPIKE", "SPEI" Lab Results  Component Value Date   FERRITIN 16 (L) 09/24/2017   Lab Results  Component Value Date   LDH 120 09/24/2017     STUDIES:   No results found.

## 2024-02-15 ENCOUNTER — Ambulatory Visit

## 2024-02-15 VITALS — BP 166/113 | HR 102 | Ht 67.0 in | Wt 216.1 lb

## 2024-02-15 DIAGNOSIS — F325 Major depressive disorder, single episode, in full remission: Secondary | ICD-10-CM | POA: Diagnosis not present

## 2024-02-15 DIAGNOSIS — I10 Essential (primary) hypertension: Secondary | ICD-10-CM | POA: Diagnosis not present

## 2024-02-15 MED ORDER — AMLODIPINE BESYLATE 5 MG PO TABS
5.0000 mg | ORAL_TABLET | Freq: Every day | ORAL | 2 refills | Status: AC
Start: 2024-02-15 — End: ?

## 2024-02-15 MED ORDER — ESCITALOPRAM OXALATE 20 MG PO TABS: 20.0000 mg | ORAL_TABLET | Freq: Every day | ORAL | 2 refills | Status: AC

## 2024-02-15 MED ORDER — HYDROCHLOROTHIAZIDE 12.5 MG PO TABS
12.5000 mg | ORAL_TABLET | Freq: Every day | ORAL | 2 refills | Status: AC
Start: 2024-02-15 — End: ?

## 2024-02-15 NOTE — Progress Notes (Unsigned)
 Established Patient Office Visit  Subjective   Patient ID: Tanner Dudley, male    DOB: 09/26/1965  Age: 58 y.o. 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  Constitutional:      Appearance: Normal appearance.  HENT:     Head: Normocephalic.  Eyes:     Extraocular Movements: Extraocular movements intact.     Pupils: Pupils are equal, round, and reactive to light.  Cardiovascular:     Rate and Rhythm: Normal rate and regular rhythm.  Pulmonary:     Effort: Pulmonary effort is normal.     Breath sounds: Normal breath sounds.  Musculoskeletal:     Cervical back: Normal range of motion and neck supple.     Right lower leg: No edema.     Left lower leg: Edema present.  Neurological:     Mental Status: He is alert and oriented to person, place, and time.  Psychiatric:        Mood and Affect: Mood normal.        Thought Content:  Thought content normal.      No results found for any visits on 02/15/24.  Last CBC Lab Results  Component Value Date   WBC 5.3 12/09/2023   HGB 17.2 (H) 12/09/2023   HCT 50.5 12/09/2023   MCV 91.3 12/09/2023   MCH 31.1 12/09/2023   RDW 13.4 12/09/2023   PLT 247 12/09/2023   Last metabolic panel Lab Results  Component Value Date   GLUCOSE 82 12/09/2023   NA 136 12/09/2023   K 3.5 12/09/2023   CL 101 12/09/2023   CO2 24 12/09/2023   BUN 8 12/09/2023   CREATININE 0.99 12/09/2023   GFRNONAA >60 12/09/2023   CALCIUM 9.5 12/09/2023   PHOS 4.0 10/10/2017   PROT 7.8 12/09/2023   ALBUMIN 4.1 12/09/2023   BILITOT 0.9 12/09/2023   ALKPHOS 56 12/09/2023   AST 22 12/09/2023   ALT 21 12/09/2023   ANIONGAP 11 12/09/2023   Last lipids No results found for: CHOL, HDL, LDLCALC, LDLDIRECT, TRIG, CHOLHDL Last hemoglobin A1c No results found for: HGBA1C Last thyroid functions Lab Results  Component Value Date   TSH 1.234 09/15/2017   Last vitamin  D No results found for: 25OHVITD2, 25OHVITD3, VD25OH    The 10-year ASCVD risk score (Arnett DK, et al., 2019) is: 27.6%    Assessment & Plan:   Problem List Items Addressed This Visit       Cardiovascular and Mediastinum   Primary hypertension - Primary   Remains poorly controlled on amlodipine  5 mg.   Will add hydrochlorothiazide  for added BP control and lower leg swelling.  Recommend low sodium diet and regular physical activity to help with BP control.   Recommend f/u in 3 months      Relevant Medications   hydrochlorothiazide  (HYDRODIURIL ) 12.5 MG tablet   amLODipine  (NORVASC ) 5 MG tablet     Other   Major depression, single episode   Stable with current dose of Lexapro  20 mg.  Continue with current dose.  Recommend f/u in 3 months or sooner if symptoms of anxiety and/or depression worsen.       Relevant Medications   escitalopram  (LEXAPRO ) 20 MG tablet    Return in about 3 months (around  05/17/2024).    Leita Longs, FNP

## 2024-02-16 DIAGNOSIS — I10 Essential (primary) hypertension: Secondary | ICD-10-CM | POA: Insufficient documentation

## 2024-02-16 NOTE — Assessment & Plan Note (Addendum)
 Remains poorly controlled on amlodipine  5 mg.   Will add hydrochlorothiazide  for added BP control and lower leg swelling.  Recommend low sodium diet and regular physical activity to help with BP control.   Recommend f/u in 3 months

## 2024-02-16 NOTE — Assessment & Plan Note (Signed)
 Stable with current dose of Lexapro  20 mg.  Continue with current dose.  Recommend f/u in 3 months or sooner if symptoms of anxiety and/or depression worsen.

## 2024-03-17 ENCOUNTER — Inpatient Hospital Stay: Attending: Hematology

## 2024-03-17 NOTE — Patient Instructions (Signed)
 [facility] [place] - A DEPT OF [facility]  Discharge Instructions: Thank you for choosing [facility] to provide your oncology and hematology care.  If you have a lab appointment with the Cancer Center - please note that after April 8th, 2024, all labs will be drawn in the cancer center.  You do not have to check in or register with the main entrance as you have in the past but will complete your check-in in the cancer center.  Wear comfortable clothing and clothing appropriate for easy access to any Portacath or PICC line.   We strive to give you quality time with your provider. You may need to reschedule your appointment if you arrive late (15 or more minutes).  Arriving late affects you and other patients whose appointments are after yours.  Also, if you miss three or more appointments without notifying the office, you may be dismissed from the clinic at the provider's discretion.      For prescription refill requests, have your pharmacy contact our office and allow 72 hours for refills to be completed.    Today you received the following Port flush   To help prevent nausea and vomiting after your treatment, we encourage you to take your nausea medication as directed.  BELOW ARE SYMPTOMS THAT SHOULD BE REPORTED IMMEDIATELY: *FEVER GREATER THAN 100.4 F (38 C) OR HIGHER *CHILLS OR SWEATING *NAUSEA AND VOMITING THAT IS NOT CONTROLLED WITH YOUR NAUSEA MEDICATION *UNUSUAL SHORTNESS OF BREATH *UNUSUAL BRUISING OR BLEEDING *URINARY PROBLEMS (pain or burning when urinating, or frequent urination) *BOWEL PROBLEMS (unusual diarrhea, constipation, pain near the anus) TENDERNESS IN MOUTH AND THROAT WITH OR WITHOUT PRESENCE OF ULCERS (sore throat, sores in mouth, or a toothache) UNUSUAL RASH, SWELLING OR PAIN  UNUSUAL VAGINAL DISCHARGE OR ITCHING   Items with * indicate a potential emergency and should be followed up as soon as possible or go to the Emergency  Department if any problems should occur.  Please show the CHEMOTHERAPY ALERT CARD or IMMUNOTHERAPY ALERT CARD at check-in to the Emergency Department and triage nurse.  Should you have questions after your visit or need to cancel or reschedule your appointment, please contact [facility] [place] - A DEPT OF [facility] [phone]  and follow the prompts.  Office hours are 8:00 a.m. to 4:30 p.m. Monday - Friday. Please note that voicemails left after 4:00 p.m. may not be returned until the following business day.  We are closed weekends and major holidays. You have access to a nurse at all times for urgent questions. Please call the main number to the clinic [phone] and follow the prompts.  For any non-urgent questions, you may also contact your provider using MyChart. We now offer e-Visits for anyone 28 and older to request care online for non-urgent symptoms. For details visit [email or web address].   Also download the MyChart app! Go to the app store, search MyChart, open the app, select [place], and log in with your MyChart username and password.

## 2024-03-17 NOTE — Progress Notes (Signed)
 Patient presents for port flush only. Patients port flushed without difficulty.  Good blood return noted with no bruising or swelling noted at site.  Gauze dressing applied.  VSS with exception of blood pressure 132/95, patient to monitor and follow up with PCP. Patient made aware of his schedule change and discharged  and left in satisfactory condition with no s/s of distress noted.

## 2024-05-11 ENCOUNTER — Encounter: Payer: Self-pay | Admitting: Oncology

## 2024-05-18 ENCOUNTER — Ambulatory Visit

## 2024-05-18 VITALS — BP 136/94 | HR 84 | Ht 67.0 in | Wt 212.0 lb

## 2024-05-18 DIAGNOSIS — I10 Essential (primary) hypertension: Secondary | ICD-10-CM | POA: Diagnosis not present

## 2024-05-18 DIAGNOSIS — H6123 Impacted cerumen, bilateral: Secondary | ICD-10-CM | POA: Diagnosis not present

## 2024-05-18 DIAGNOSIS — J0100 Acute maxillary sinusitis, unspecified: Secondary | ICD-10-CM | POA: Diagnosis not present

## 2024-05-18 MED ORDER — AMOXICILLIN 875 MG PO TABS: 875.0000 mg | ORAL_TABLET | Freq: Two times a day (BID) | ORAL | 0 refills | Status: AC

## 2024-05-18 NOTE — Progress Notes (Signed)
 Established Patient Office Visit  Subjective   Patient ID: Tanner Dudley, male    DOB: June 08, 1966  Age: 58 y.o. MRN: [id number]  Chief Complaint  Patient presents with   Medical Management of Chronic Issues    3 MONTH FOLLOW UP     HPI Discussed the use of AI scribe software for clinical note transcription with the patient, who gave verbal consent to proceed.  History of Present Illness   Tanner Dudley is a 58 year old male who presents with sinus issues and lightheadedness.  Sinus discomfort - Significant sinus discomfort described as 'killing' him lately - No regular medication use for sinus symptoms prior to last night - Initiated Sudafed last night with possible partial symptom relief - Facial discomfort when sinuses are symptomatic - No ear pain, but acknowledges possible cerumen accumulation  Lightheadedness and disequilibrium - Lightheadedness and occasional sensation of near loss of balance - Symptoms present for approximately three weeks - Onset coincided with sinus symptoms - Uncertain if symptoms are related to sinus issues or another etiology  Polycythemia management - Receives care at a cancer center for blood draws when hematocrit is elevated - Uncertain if current symptoms are related to underlying hematologic condition - Upcoming laboratory evaluation and follow-up at cancer center scheduled for next month  Antihypertensive medication nonadherence - Forgot to take blood pressure medication this morning - Frequently misses doses of antihypertensive medication - No known drug allergies - Currently managing prescriptions with some refills available for pickup      Patient Active Problem List   Diagnosis Date Noted   Acute non-recurrent maxillary sinusitis 05/22/2024   Bilateral impacted cerumen 05/22/2024   Primary hypertension 02/16/2024   Erythrocytosis 06/16/2023   Snoring 07/30/2020   Witnessed episode of apnea 07/30/2020   Excessive sleepiness  07/30/2020   Tobacco use disorder 03/14/2019   S/P colostomy (HCC) 10/09/2017   Rectal cancer (HCC)    Rectal mass    Proctocolitis with rectal bleeding    Rectal bleeding 09/14/2017   Diarrhea 09/14/2017   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  Constitutional:      Appearance: Normal appearance.  HENT:     Head: Normocephalic.     Right Ear: Tympanic membrane, ear canal and external ear normal.     Left Ear: Tympanic membrane, ear canal and external ear normal.     Nose: Congestion and rhinorrhea present.     Right Turbinates: Enlarged.     Left Turbinates: Enlarged.     Right Sinus: No maxillary sinus tenderness or frontal sinus tenderness.     Left Sinus: No maxillary sinus tenderness or frontal sinus tenderness.     Mouth/Throat:     Mouth: Mucous membranes are moist.     Pharynx: Oropharynx is clear.  Eyes:  Extraocular Movements: Extraocular movements intact.     Pupils: Pupils are equal, round, and reactive to light.  Pulmonary:     Effort: Pulmonary effort is normal.     Breath sounds: Normal breath sounds.  Skin:    General: Skin is warm and dry.  Neurological:     Mental Status: He is alert and oriented to person, place, and time.  Psychiatric:        Mood and Affect: Mood normal.        Thought Content: Thought content normal.    No results found for any visits on 05/18/24.    The 10-year ASCVD risk score (Arnett DK, et al., 2019) is:  19.8%    Assessment & Plan:   Problem List Items Addressed This Visit       Cardiovascular and Mediastinum   Primary hypertension - Primary   Stable with current medication regimen.   Recommend f/u in 3 months        Respiratory   Acute non-recurrent maxillary sinusitis   Acute sinusitis Acute sinusitis with facial tenderness and persistent symptoms. Sudafed may have elevated blood pressure. - Prescribe amoxicillin for 1 week.      Relevant Medications   amoxicillin (AMOXIL) 875 MG tablet     Nervous and Auditory   Bilateral impacted cerumen   Impacted cerumen, bilateral Bilateral impacted cerumen may affect ear pressure and balance. - Perform ear irrigation.      Relevant Orders   Ear Lavage    No follow-ups on file.    Leita Longs, FNP

## 2024-05-22 DIAGNOSIS — H6123 Impacted cerumen, bilateral: Secondary | ICD-10-CM | POA: Insufficient documentation

## 2024-05-22 DIAGNOSIS — J0100 Acute maxillary sinusitis, unspecified: Secondary | ICD-10-CM | POA: Insufficient documentation

## 2024-05-22 NOTE — Assessment & Plan Note (Signed)
 Impacted cerumen, bilateral Bilateral impacted cerumen may affect ear pressure and balance. - Perform ear irrigation.

## 2024-05-22 NOTE — Assessment & Plan Note (Signed)
 Stable with current medication regimen.   Recommend f/u in 3 months

## 2024-05-22 NOTE — Assessment & Plan Note (Signed)
 Acute sinusitis Acute sinusitis with facial tenderness and persistent symptoms. Sudafed may have elevated blood pressure. - Prescribe amoxicillin for 1 week.

## 2024-06-16 ENCOUNTER — Inpatient Hospital Stay: Attending: Hematology

## 2024-06-16 DIAGNOSIS — C20 Malignant neoplasm of rectum: Secondary | ICD-10-CM

## 2024-06-16 LAB — CBC WITH DIFFERENTIAL/PLATELET
Abs Immature Granulocytes: 0.02 K/uL (ref 0.00–0.07)
Basophils Absolute: 0.1 K/uL (ref 0.0–0.1)
Basophils Relative: 2 %
Eosinophils Absolute: 0.2 K/uL (ref 0.0–0.5)
Eosinophils Relative: 3 %
HCT: 44.4 % (ref 39.0–52.0)
Hemoglobin: 15.1 g/dL (ref 13.0–17.0)
Immature Granulocytes: 0 %
Lymphocytes Relative: 32 %
Lymphs Abs: 1.8 K/uL (ref 0.7–4.0)
MCH: 30.8 pg (ref 26.0–34.0)
MCHC: 34 g/dL (ref 30.0–36.0)
MCV: 90.4 fL (ref 80.0–100.0)
Monocytes Absolute: 0.8 K/uL (ref 0.1–1.0)
Monocytes Relative: 14 %
Neutro Abs: 2.8 K/uL (ref 1.7–7.7)
Neutrophils Relative %: 49 %
Platelets: 252 K/uL (ref 150–400)
RBC: 4.91 MIL/uL (ref 4.22–5.81)
RDW: 14.1 % (ref 11.5–15.5)
WBC: 5.6 K/uL (ref 4.0–10.5)
nRBC: 0 % (ref 0.0–0.2)

## 2024-06-16 LAB — COMPREHENSIVE METABOLIC PANEL WITH GFR
ALT: 17 U/L (ref 0–44)
AST: 22 U/L (ref 15–41)
Albumin: 4.5 g/dL (ref 3.5–5.0)
Alkaline Phosphatase: 58 U/L (ref 38–126)
Anion gap: 13 (ref 5–15)
BUN: 28 mg/dL — ABNORMAL HIGH (ref 6–20)
CO2: 23 mmol/L (ref 22–32)
Calcium: 9.2 mg/dL (ref 8.9–10.3)
Chloride: 100 mmol/L (ref 98–111)
Creatinine, Ser: 1.56 mg/dL — ABNORMAL HIGH (ref 0.61–1.24)
GFR, Estimated: 52 mL/min — ABNORMAL LOW (ref >60)
Glucose, Bld: 63 mg/dL — ABNORMAL LOW (ref 70–99)
Potassium: 4 mmol/L (ref 3.5–5.1)
Sodium: 136 mmol/L (ref 135–145)
Total Bilirubin: 0.5 mg/dL (ref 0.0–1.2)
Total Protein: 7.6 g/dL (ref 6.5–8.1)

## 2024-06-16 NOTE — Progress Notes (Signed)
 Patients port flushed without difficulty.  Good blood return noted with no bruising or swelling noted at site.  Band aid applied.  Labs drawn per orders. VSS with discharge and left in satisfactory condition with no s/s of distress noted. All follow ups as scheduled.       Aimy Sweeting [employer]

## 2024-06-17 LAB — CEA: CEA: 3.9 ng/mL (ref 0.0–4.7)

## 2024-06-23 ENCOUNTER — Inpatient Hospital Stay (HOSPITAL_BASED_OUTPATIENT_CLINIC_OR_DEPARTMENT_OTHER): Admitting: Oncology

## 2024-06-23 DIAGNOSIS — C20 Malignant neoplasm of rectum: Secondary | ICD-10-CM | POA: Diagnosis not present

## 2024-06-23 NOTE — Progress Notes (Signed)
 [facility] [address] [place] [address]   Clinic Day:  06/23/2024  Referring physician: [name], FNP  Patient Care Team: [name], FNP as PCP - General (Family Medicine)   ASSESSMENT & PLAN:   Assessment:  1.  Stage II rectal cancer: - Diagnosed in 2019, treated with TNT.  Received 8 cycles of neoadjuvant chemotherapy with FOLFOX followed by chemoradiation therapy with Xeloda . - S/p APR in December 2019 with end colostomy creation. - He was found to have a soft tissue recurrence in the right perineum in March 2023, for which she received FOLFIRI plus panitumumab for oligometastatic disease.  Tumor was negative for KRAS, NRAS and BRAF. - This was followed by curative/salvage cystoprostatectomy/penilectomy in August 2023.  He did have deep margin involvement in 2 areas as well as pubic bone biopsies which were positive for adenocarcinoma.  He had a long recovery time. - He is switched care to our cancer center as previous oncologist is no longer in the network.  2.  Social/family history: - He is independent of ADLs and IADLs.  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  He is a current active smoker, 1 pack/day started at age 60. - Brother had prostate cancer.  Another brother had skin cancer.  Plan:  1.  Recurrent rectal cancer: - He denies any change in bowel habits.  He denies any bleeding per rectum or melena. - Labs on 06/19/2024: Normal LFTs.  CBC grossly normal.  CEA is 3.9. - Reviewed with and without contrast and MRI abdomen with and without contrast from 06/24/2023 showed a T2 hypodense area in the perineum which favored to represent postsurgical changes fibrotic tissue and focal fatty deposition adjacent to the falciform ligament as seen by prior CT characteristics and location and appearance.  No suspicious liver lesions or contrast-enhancement.  No evidence of lymphadenopathy or metastatic disease in the abdomen. -  He is 2 years post surgery.  Moving forward, we will do imaging once a year.  RTC 6 months for follow-up with repeat labs including CEA. -Imaging in 1 year.  2.  Hypertension: - He is on Norvasc  5 mg daily.  Blood pressure is 132/91.  He reports occasional dizziness.  I have recommended that he check his blood pressure at home and call us  or his PMD.  If it continues to be high, we may have to increase Norvasc  dose.  3.  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  Thought to be secondary to smoking.  He had phlebotomy done on 07/14/2023 with slightly improved energy levels.  He had another phlebotomy on 10/29/2023, but did not feel better after the second time.   -Most recent CBC shows hemoglobin of 15.1 hematocrit 44.4.  No additional phlebotomy needed at this time.   Orders Placed This Encounter  Procedures   CBC with Differential    Standing Status:   Future    Expected Date:   12/21/2024    Expiration Date:   03/21/2025   Comprehensive metabolic panel    Standing Status:   Future    Expected Date:   12/21/2024    Expiration Date:   03/21/2025   CEA    Standing Status:   Future    Expected Date:   12/21/2024    Expiration Date:   03/21/2025     [name], NP   11/20/20252:41 PM  CHIEF COMPLAINT/PURPOSE OF CONSULT:  Diagnosis: recurrent low rectal adenocarcinoma   Cancer Staging  No matching staging information was found for the patient.    Prior Therapy: 1. Neoadjuvant FOLFOX, 11/03/17 - 03/03/18 2. Concurrent chemoradiation with Xeloda , 03/22/18 - 05/02/18 3. Abdominoperineal resection, 07/20/18 4. FOLFIRI and pamitumumab, 11/06/21 - 03/2022 5. Cystoprostatectomy, 03/21/22 6. Total penectomy, 05/01/22  Current Therapy:  surveillance   HISTORY OF PRESENT ILLNESS:   Oncology History   No history exists.     Tanner Dudley is a 58 y.o. male presenting to clinic today for evaluation of recurrent low rectal adenocarcinoma at the  request of Dr. Letty at [facility].  In summary, he initially presented in 08/2017 with a one-year history of rectal bleeding. Colonoscopy from 09/16/17 revealed a lesion in distal rectum, and biopsy confirmed invasive adenocarcinoma. Staging scans were negative for metastatic disease. He was treated with neoadjuvant chemotherapy with FOLFOX, followed by radiation therapy with concurrent capecitabine , followed by abdominoperineal resection. Pathology from definitive surgery showed two separate invasive rectal adenocarcinomas, measuring 3 cm and 0.8 cm and staged ypT3 and ypT1 respectively. All margins and 14 lymph nodes were negative. He was then placed on surveillance.  He was unfortunately found to have recurrent oligometastatic disease in right perineum on surveillance scan in 10/2021. He was treated with FOLFIRI with panitumumab, followed by cystoprostatectomy, and then total penectomy. He is now back on surveillance.  Today, he states that he is doing well overall. His appetite level is at 50%. His energy level is at 20%.  INTERVAL HISTORY:   Tanner Dudley is a 58 y.o. male presenting to the clinic today for follow-up of recurrent low rectal adenocarcinoma.   Today, he states that he is doing well overall. His appetite level is at 100%. His energy level is at 100%.  Denies any bright red blood per rectum, melena or hematochezia.  Denies any hospitalizations, surgeries or changes to baseline health.  Overall he is feeling good.  PAST MEDICAL HISTORY:   Past Medical History: Past Medical History:  Diagnosis Date   Allergy    Depression    HOH (hard of hearing)    Medical history non-contributory    Rectal cancer [facility])     Surgical History: Past Surgical History:  Procedure Laterality Date   BIOPSY  09/16/2017   Procedure: BIOPSY;  Surgeon: Shaaron Lamar HERO, MD;  [facility];  Service: Gastroenterology;;  anal rectal   COLONOSCOPY WITH PROPOFOL  N/A 09/16/2017   Procedure:  COLONOSCOPY WITH PROPOFOL ;  Surgeon: Shaaron Lamar HERO, MD;  Location: [facility];  Service: Gastroenterology;  Laterality: N/A;   COLOSTOMY N/A 10/09/2017   Procedure: DIVERTING LOOP COLOSTOMY (PALLATIVE);  Surgeon: [name], MD;  Location: [facility] ORS;  Service: General;  Laterality: N/A;   None      Social History: Social History   Socioeconomic History   Marital status: Legally Separated    Spouse name: Not on file   Number of children: 0   Years of education: Not on file   Highest education level: 12th grade  Occupational History   Occupation: self employed  Tobacco Use   Smoking status: Every Day    Current packs/day: 0.50    Average packs/day: 0.5 packs/day for 26.9 years (13.4 ttl pk-yrs)    Types: Cigarettes    Start date: 08/14/1997   Smokeless tobacco: Never  Vaping Use   Vaping status: Never Used  Substance and Sexual Activity   Alcohol use: Not Currently    Comment: 2-3 beers, 3-4 times  per week    Drug use: No   Sexual activity: Not Currently  Other Topics Concern   Not on file  Social History Narrative   Separated for 6 weeks as of 09/24/17   No biological children   Social Drivers of Health   Financial Resource Strain: Low Risk (07/18/2022)   Received [facility]   Overall Financial Resource Strain (CARDIA)    Difficulty of Paying Living Expenses: Not very hard  Food Insecurity: No Food Insecurity (11/16/2023)   Hunger Vital Sign    Worried About Running Out of Food in the Last Year: Never true    Ran Out of Food in the Last Year: Never true  Transportation Needs: No Transportation Needs (11/16/2023)   PRAPARE - Administrator, Civil Service (Medical): No    Lack of Transportation (Non-Medical): No  Physical Activity: Inactive (10/15/2020)   Received from [facility]   Exercise Vital Sign    On average, how many days per week do you engage in moderate to strenuous exercise (like a brisk walk)?: 0 days    On average, how many  minutes do you engage in exercise at this level?: 0 min  Stress: No Stress Concern Present (10/15/2021)   Received from [facility] - Occupational Stress Questionnaire    Feeling of Stress : Not at all  Social Connections: Moderately Isolated (10/02/2021)   Received from [facility]   Social Connection and Isolation Panel    In a typical week, how many times do you talk on the phone with family, friends, or neighbors?: More than three times a week    How often do you get together with friends or relatives?: Never    How often do you attend church or religious services?: Never    Do you belong to any clubs or organizations such as church groups, unions, fraternal or athletic groups, or school groups?: No    How often do you attend meetings of the clubs or organizations you belong to?: Never    Are you married, widowed, divorced, separated, never married, or living with a partner?: Married  Intimate Partner Violence: Not At Risk (11/16/2023)   Humiliation, Afraid, Rape, and Kick questionnaire    Fear of Current or Ex-Partner: No    Emotionally Abused: No    Physically Abused: No    Sexually Abused: No    Family History: Family History  Problem Relation Age of Onset   Colon cancer Father        diagnosed in his 74s, succumbed to disease   Diabetes Mother    Heart attack Mother    Hypertension Mother    Pancreatic cancer Sister    Prostate cancer Brother    Melanoma Brother    Inflammatory bowel disease Neg Hx    Colon polyps Neg Hx     Current Medications:  Current Outpatient Medications:    amLODipine  (NORVASC ) 5 MG tablet, Take 1 tablet (5 mg total) by mouth daily., Disp: 90 tablet, Rfl: 2   escitalopram  (LEXAPRO ) 20 MG tablet, Take 1 tablet (20 mg total) by mouth daily., Disp: 90 tablet, Rfl: 2   hydrochlorothiazide  (HYDRODIURIL ) 12.5 MG tablet, Take 1 tablet (12.5 mg total) by mouth daily., Disp: 90 tablet, Rfl: 2    Allergies: No Known Allergies  REVIEW OF SYSTEMS:   Review of Systems  Constitutional:  Negative for fatigue.  Neurological:  Negative for dizziness  and light-headedness.  Psychiatric/Behavioral:  Negative for confusion and sleep disturbance. The patient is not nervous/anxious.      VITALS:   Blood pressure (!) 132/91, pulse 78, temperature 98 F (36.7 C), temperature source Oral, resp. rate 16, weight 212 lb 11.9 oz (96.5 kg), SpO2 96%.  Wt Readings from Last 3 Encounters:  06/23/24 212 lb 11.9 oz (96.5 kg)  05/18/24 212 lb 0.6 oz (96.2 kg)  02/15/24 216 lb 1.9 oz (98 kg)    Body mass index is 33.32 kg/m.  Performance status (ECOG): 1 - Symptomatic but completely ambulatory  PHYSICAL EXAM:   Physical Exam Constitutional:      Appearance: Normal appearance.  Cardiovascular:     Rate and Rhythm: Normal rate and regular rhythm.  Pulmonary:     Effort: Pulmonary effort is normal.     Breath sounds: Normal breath sounds.  Abdominal:     General: Bowel sounds are normal.     Palpations: Abdomen is soft.  Musculoskeletal:        General: No swelling. Normal range of motion.  Neurological:     Mental Status: He is alert and oriented to person, place, and time. Mental status is at baseline.   Left-sided colostomy.  LABS:      Latest Ref Rng & Units 06/16/2024    2:14 PM 12/09/2023   12:05 PM 10/22/2023   10:47 AM  CBC  WBC 4.0 - 10.5 K/uL 5.6  5.3  6.1   Hemoglobin 13.0 - 17.0 g/dL 84.8  82.7  81.3   Hematocrit 39.0 - 52.0 % 44.4  50.5  55.6   Platelets 150 - 400 K/uL 252  247  228       Latest Ref Rng & Units 06/16/2024    2:14 PM 12/09/2023   12:05 PM 09/03/2023    1:05 PM  CMP  Glucose 70 - 99 mg/dL 63  82  88   BUN 6 - 20 mg/dL 28  8  18    Creatinine 0.61 - 1.24 mg/dL 8.43  9.00  8.99   Sodium 135 - 145 mmol/L 136  136  135   Potassium 3.5 - 5.1 mmol/L 4.0  3.5  4.1   Chloride 98 - 111 mmol/L 100  101  102   CO2 22 - 32 mmol/L 23  24  23    Calcium 8.9  - 10.3 mg/dL 9.2  9.5  9.4   Total Protein 6.5 - 8.1 g/dL 7.6  7.8  8.0   Total Bilirubin 0.0 - 1.2 mg/dL 0.5  0.9  0.8   Alkaline Phos 38 - 126 U/L 58  56  57   AST 15 - 41 U/L 22  22  40   ALT 0 - 44 U/L 17  21  57      Lab Results  Component Value Date   CEA1 3.9 06/16/2024   /  CEA  Date Value Ref Range Status  06/16/2024 3.9 0.0 - 4.7 ng/mL Final    Comment:    (NOTE)                             Nonsmokers          <3.9                             Smokers             <  5.6 Roche [employer] Electrochemiluminescence Immunoassay (ECLIA) Values obtained with different assay methods or kits cannot be used interchangeably.  Results cannot be interpreted as absolute evidence of the presence or absence of malignant disease. Performed At: [facility] [address] [place], [place] [id number] Jennette Shorter MD Ey:[id number]    No results found for: PSA1 No results found for: CAN199 No results found for: CAN125  No results found for: TOTALPROTELP, ALBUMINELP, A1GS, A2GS, BETS, BETA2SER, GAMS, MSPIKE, SPEI Lab Results  Component Value Date   FERRITIN 16 (L) 09/24/2017   Lab Results  Component Value Date   LDH 120 09/24/2017     STUDIES:   No results found.

## 2024-07-07 ENCOUNTER — Emergency Department (HOSPITAL_COMMUNITY): Payer: Self-pay

## 2024-07-07 ENCOUNTER — Other Ambulatory Visit: Payer: Self-pay

## 2024-07-07 ENCOUNTER — Encounter (HOSPITAL_COMMUNITY): Payer: Self-pay

## 2024-07-07 ENCOUNTER — Inpatient Hospital Stay (HOSPITAL_COMMUNITY)
Admission: EM | Admit: 2024-07-07 | Discharge: 2024-07-09 | DRG: 389 | Disposition: A | Payer: Self-pay | Attending: Internal Medicine | Admitting: Internal Medicine

## 2024-07-07 DIAGNOSIS — K56609 Unspecified intestinal obstruction, unspecified as to partial versus complete obstruction: Secondary | ICD-10-CM | POA: Diagnosis not present

## 2024-07-07 LAB — URINALYSIS, ROUTINE W REFLEX MICROSCOPIC
Bilirubin Urine: NEGATIVE
Glucose, UA: NEGATIVE mg/dL
Hgb urine dipstick: NEGATIVE
Ketones, ur: NEGATIVE mg/dL
Leukocytes,Ua: NEGATIVE
Nitrite: NEGATIVE
Protein, ur: NEGATIVE mg/dL
Specific Gravity, Urine: 1.011 (ref 1.005–1.030)
pH: 6 (ref 5.0–8.0)

## 2024-07-07 LAB — COMPREHENSIVE METABOLIC PANEL WITH GFR
ALT: 15 U/L (ref 0–44)
AST: 23 U/L (ref 15–41)
Albumin: 5 g/dL (ref 3.5–5.0)
Alkaline Phosphatase: 70 U/L (ref 38–126)
Anion gap: 19 — ABNORMAL HIGH (ref 5–15)
BUN: 28 mg/dL — ABNORMAL HIGH (ref 6–20)
CO2: 23 mmol/L (ref 22–32)
Calcium: 11 mg/dL — ABNORMAL HIGH (ref 8.9–10.3)
Chloride: 98 mmol/L (ref 98–111)
Creatinine, Ser: 1.87 mg/dL — ABNORMAL HIGH (ref 0.61–1.24)
GFR, Estimated: 42 mL/min — ABNORMAL LOW (ref >60)
Glucose, Bld: 137 mg/dL — ABNORMAL HIGH (ref 70–99)
Potassium: 4.5 mmol/L (ref 3.5–5.1)
Sodium: 139 mmol/L (ref 135–145)
Total Bilirubin: 0.7 mg/dL (ref 0.0–1.2)
Total Protein: 9 g/dL — ABNORMAL HIGH (ref 6.5–8.1)

## 2024-07-07 LAB — CBC
HCT: 51.4 % (ref 39.0–52.0)
Hemoglobin: 17.3 g/dL — ABNORMAL HIGH (ref 13.0–17.0)
MCH: 30.5 pg (ref 26.0–34.0)
MCHC: 33.7 g/dL (ref 30.0–36.0)
MCV: 90.5 fL (ref 80.0–100.0)
Platelets: 318 K/uL (ref 150–400)
RBC: 5.68 MIL/uL (ref 4.22–5.81)
RDW: 13.7 % (ref 11.5–15.5)
WBC: 11.8 K/uL — ABNORMAL HIGH (ref 4.0–10.5)
nRBC: 0 % (ref 0.0–0.2)

## 2024-07-07 LAB — LIPASE, BLOOD: Lipase: 20 U/L (ref 11–51)

## 2024-07-07 MED ORDER — ENOXAPARIN SODIUM 40 MG/0.4ML IJ SOSY
40.0000 mg | PREFILLED_SYRINGE | INTRAMUSCULAR | Status: DC
  Administered 2024-07-08: 40 mg via SUBCUTANEOUS
  Filled 2024-07-07 (×2): qty 0.4

## 2024-07-07 MED ORDER — KETOROLAC TROMETHAMINE 15 MG/ML IJ SOLN: 15.0000 mg | Freq: Four times a day (QID) | INTRAMUSCULAR | Status: DC | PRN

## 2024-07-07 MED ORDER — PROCHLORPERAZINE EDISYLATE 10 MG/2ML IJ SOLN: 5.0000 mg | Freq: Four times a day (QID) | INTRAMUSCULAR | Status: DC | PRN

## 2024-07-07 MED ORDER — SODIUM CHLORIDE 0.9 % IV BOLUS
1000.0000 mL | Freq: Once | INTRAVENOUS | Status: AC
  Administered 2024-07-07: 1000 mL via INTRAVENOUS

## 2024-07-07 MED ORDER — HYDROMORPHONE HCL 1 MG/ML IJ SOLN
0.5000 mg | Freq: Once | INTRAMUSCULAR | Status: AC
  Administered 2024-07-07: 0.5 mg via INTRAVENOUS
  Filled 2024-07-07: qty 0.5

## 2024-07-07 MED ORDER — ONDANSETRON HCL 4 MG/2ML IJ SOLN
4.0000 mg | Freq: Once | INTRAMUSCULAR | Status: AC
  Administered 2024-07-07: 4 mg via INTRAVENOUS
  Filled 2024-07-07: qty 2

## 2024-07-07 MED ORDER — IOHEXOL 300 MG/ML  SOLN
80.0000 mL | Freq: Once | INTRAMUSCULAR | Status: AC | PRN
  Administered 2024-07-07: 80 mL via INTRAVENOUS

## 2024-07-07 MED ORDER — DIATRIZOATE MEGLUMINE & SODIUM 66-10 % PO SOLN
90.0000 mL | Freq: Once | ORAL | Status: AC
  Administered 2024-07-08: 90 mL via NASOGASTRIC
  Filled 2024-07-07: qty 90

## 2024-07-07 MED ORDER — HYDRALAZINE HCL 20 MG/ML IJ SOLN
5.0000 mg | Freq: Four times a day (QID) | INTRAMUSCULAR | Status: DC | PRN
  Administered 2024-07-08: 5 mg via INTRAVENOUS
  Filled 2024-07-07 (×2): qty 1

## 2024-07-07 MED ORDER — LACTATED RINGERS IV SOLN: INTRAVENOUS | Status: AC

## 2024-07-07 NOTE — Consult Note (Signed)
 Initial Consultation Note   Patient: Tanner Dudley FMW:[id number] DOB: 1966-05-23 PCP: [name], FNP DOA: 07/07/2024 DOS: the patient was seen and examined on 07/07/2024 Primary service: Suzette Pac, MD  Referring physician: Dr. Suzette, EDP Reason for consult: SBO  Assessment/Plan: SBO History of prior SBO Extensive abdominal surgeries done at [facility] with prolonged postoperative course Continue supportive care LR at 100 cc/h x 1 day Optimize magnesium and potassium levels Early mobilization Pain control as needed and IV antiemetics as needed SBO protocol NG tube placement with Gastrografin  Repeat abdominal x-ray in the morning N.p.o. except ice chips.  AKI, suspect multifactorial Baseline creatinine 0.9 with GFR greater than 60 Presented with creatinine 1.87 with GFR 42, BUN 28. IV fluid hydration LR 100 cc/h x 1 day Bilateral hydronephrosis seen on CT scan He also has Urologic findings that are concerning for anastomotic strictures.  Monitor urine output Repeat BMP in the morning.  Hypertension, BP is not at goal, elevated Hold off home oral antihypertensives in the setting of SBO IV antihypertensives as needed with parameters Closely monitor vital signs.  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 Continue IV fluid hydration Recommend outpatient follow-up with medical oncology   Time: 45 minutes.   TRH will continue to follow the patient.  HPI: Tanner Dudley is a 58 y.o. male with past medical history of hypertension, Jak2/V617F negative erythrocytosis, recurrent rectal cancer status post end colostomy creation in 2019, cystoprostatectomy, penilectomy in August 2023, prior SBO treated at [facility], extensive surgical history at [facility], with prolonged postoperative course, who presents to the ER with complaints of abdominal distention and bloating since yesterday afternoon around 3 PM.  Has not had any air or stool in his colostomy bag for the past  24 hours.  Prior to that was having stools and having to empty his colostomy bag daily.  Associated with diffuse abdominal pain, nausea, and wanting to vomit but unable.  States symptoms are similar to prior SBO.  Symptoms started after eating raw peanuts yesterday afternoon.  In the ER, tachycardic, uncomfortable appearing due to significant abdominal pain and nausea.  Lab studies are remarkable for elevated creatinine 1.87 from baseline of 0.9.  WBC 11.8.  CT abdomen pelvis with contrast revealed distal small bowel obstruction with single transition point within the deep pelvis.  Moderate bilateral hydronephrosis and hydroureter to the ureteroenteric anastomoses favoring anastomosis strictures, less likely developing chronic reflux, with a decompressed ileal conduit.  Discussed the case with general surgery, Dr. Mavis.  The patient had a prolonged postoperative course at [facility].  Recommended to reach out to [facility] to see if they would accept transfer of the patient, given his extensive surgical history with [facility].  EDP, Dr. Zammit, was made aware and will initiate transfer request.  TRH will continue to see the patient in consultation until transferred to [facility].    Review of Systems: As mentioned in the history of present illness. All other systems reviewed and are negative. Past Medical History:  Diagnosis Date   Allergy    Depression    HOH (hard of hearing)    Medical history non-contributory    Rectal cancer [facility])    Past Surgical History:  Procedure Laterality Date   BIOPSY  09/16/2017   Procedure: BIOPSY;  Surgeon: Shaaron Lamar HERO, MD;  [facility];  Service: Gastroenterology;;  anal rectal   COLONOSCOPY WITH PROPOFOL  N/A 09/16/2017   Procedure: COLONOSCOPY WITH PROPOFOL ;  Surgeon: Shaaron Lamar HERO, MD;  Location: [facility];  Service: Gastroenterology;  Laterality: N/A;   COLOSTOMY N/A 10/09/2017   Procedure: DIVERTING LOOP COLOSTOMY (PALLATIVE);  Surgeon: [name], MD;   Location: [facility]S;  Service: General;  Laterality: N/A;   None     Social History:  reports that he has been smoking cigarettes. He started smoking about 26 years ago. He has a 13.4 pack-year smoking history. He has never used smokeless tobacco. He reports that he does not currently use alcohol. He reports that he does not use drugs.  No Known Allergies  Family History  Problem Relation Age of Onset   Colon cancer Father        diagnosed in his 87s, succumbed to disease   Diabetes Mother    Heart attack Mother    Hypertension Mother    Pancreatic cancer Sister    Prostate cancer Brother    Melanoma Brother    Inflammatory bowel disease Neg Hx    Colon polyps Neg Hx     Prior to Admission medications   Medication Sig Start Date End Date Taking? Authorizing Provider  amLODipine  (NORVASC ) 5 MG tablet Take 1 tablet (5 mg total) by mouth daily. 02/15/24   [name], FNP  escitalopram  (LEXAPRO ) 20 MG tablet Take 1 tablet (20 mg total) by mouth daily. 02/15/24   [name], FNP  hydrochlorothiazide  (HYDRODIURIL ) 12.5 MG tablet Take 1 tablet (12.5 mg total) by mouth daily. 02/15/24   [name], FNP    Physical Exam: Vitals:   07/07/24 1638 07/07/24 1639  BP: (!) 158/96   Pulse: 80   Resp: 17   Temp: 98.4 F (36.9 C)   SpO2: 98%   Weight:  96.5 kg  Height:  5' 7 (1.702 m)   Well-developed well-nourished uncomfortable appearing due to abdominal pain and nausea. Regular rate and rhythm no rubs or gallops. Clear to auscultation with no wheezes or rales. Hypoactive bowel sounds.  Colostomy bag in place with no stool or air. No lower extremity edema. Mood is appropriate for condition and setting.  Data Reviewed:  All current data reviewed.   Family Communication: His brother at bedside. Primary team communication: ER physician Dr. Zammit and general surgery Dr. Mavis.  Thank you very much for involving us  in the care of your patient.  Author: Terry LOISE Hurst,  DO 07/07/2024 9:59 PM  For on call review [email or web address].

## 2024-07-07 NOTE — ED Provider Notes (Signed)
 [place] EMERGENCY DEPARTMENT AT [facility] Provider Note   CSN: [id number] Arrival date & time: 07/07/24  1609     Patient presents with: Abdominal Pain   Tanner Dudley is a 58 y.o. male.   Patient with abdominal pain.  He has a colostomy from rectal cancer.  Patient also has a urostomy.  Patient has not had any vomiting but some nausea.  He has not passed gas today  The history is provided by medical records. No language interpreter was used.  Abdominal Pain Pain location:  Generalized Pain quality: aching   Pain radiates to:  Does not radiate Pain severity:  Moderate Onset quality:  Sudden Timing:  Intermittent Progression:  Waxing and waning Chronicity:  Recurrent Context: not alcohol use   Relieved by:  Nothing Worsened by:  Nothing Ineffective treatments:  None tried Associated symptoms: no chest pain, no cough, no diarrhea, no fatigue and no hematuria        Prior to Admission medications   Medication Sig Start Date End Date Taking? Authorizing Provider  amLODipine  (NORVASC ) 5 MG tablet Take 1 tablet (5 mg total) by mouth daily. 02/15/24   [name], FNP  escitalopram  (LEXAPRO ) 20 MG tablet Take 1 tablet (20 mg total) by mouth daily. 02/15/24   [name], FNP  hydrochlorothiazide  (HYDRODIURIL ) 12.5 MG tablet Take 1 tablet (12.5 mg total) by mouth daily. 02/15/24   [name], FNP    Allergies: Patient has no known allergies.    Review of Systems  Constitutional:  Negative for appetite change and fatigue.  HENT:  Negative for congestion, ear discharge and sinus pressure.   Eyes:  Negative for discharge.  Respiratory:  Negative for cough.   Cardiovascular:  Negative for chest pain.  Gastrointestinal:  Positive for abdominal pain. Negative for diarrhea.  Genitourinary:  Negative for frequency and hematuria.  Musculoskeletal:  Negative for back pain.  Skin:  Negative for rash.  Neurological:  Negative for seizures and headaches.   Psychiatric/Behavioral:  Negative for hallucinations.     Updated Vital Signs BP (!) 158/96 (BP Location: Right Arm)   Pulse 80   Temp 98.4 F (36.9 C)   Resp 17   Ht 5' 7 (1.702 m)   Wt 96.5 kg   SpO2 98%   BMI 33.32 kg/m   Physical Exam Vitals and nursing note reviewed.  Constitutional:      Appearance: He is well-developed.  HENT:     Head: Normocephalic.     Nose: Nose normal.  Eyes:     General: No scleral icterus.    Conjunctiva/sclera: Conjunctivae normal.  Neck:     Thyroid: No thyromegaly.  Cardiovascular:     Rate and Rhythm: Normal rate and regular rhythm.     Heart sounds: No murmur heard.    No friction rub. No gallop.  Pulmonary:     Breath sounds: No stridor. No wheezing or rales.  Chest:     Chest wall: No tenderness.  Abdominal:     General: There is distension.     Tenderness: There is abdominal tenderness. There is no rebound.  Musculoskeletal:        General: Normal range of motion.     Cervical back: Neck supple.  Lymphadenopathy:     Cervical: No cervical adenopathy.  Skin:    Findings: No erythema or rash.  Neurological:     Mental Status: He is alert and oriented to person, place, and time.     Motor:  No abnormal muscle tone.     Coordination: Coordination normal.  Psychiatric:        Behavior: Behavior normal.     (all labs ordered are listed, but only abnormal results are displayed) Labs Reviewed  COMPREHENSIVE METABOLIC PANEL WITH GFR - Abnormal; Notable for the following components:      Result Value   Glucose, Bld 137 (*)    BUN 28 (*)    Creatinine, Ser 1.87 (*)    Calcium 11.0 (*)    Total Protein 9.0 (*)    GFR, Estimated 42 (*)    Anion gap 19 (*)    All other components within normal limits  CBC - Abnormal; Notable for the following components:   WBC 11.8 (*)    Hemoglobin 17.3 (*)    All other components within normal limits  URINALYSIS, ROUTINE W REFLEX MICROSCOPIC - Abnormal; Notable for the following  components:   APPearance HAZY (*)    All other components within normal limits  LIPASE, BLOOD    EKG: None  Radiology: CT ABDOMEN PELVIS W CONTRAST Result Date: 07/07/2024 EXAM: CT ABDOMEN AND PELVIS WITH CONTRAST 07/07/2024 08:11:24 PM TECHNIQUE: CT of the abdomen and pelvis was performed with the administration of 80 mL of iohexol  (OMNIPAQUE ) 300 MG/ML solution. Multiplanar reformatted images are provided for review. Automated exposure control, iterative reconstruction, and/or weight-based adjustment of the mA/kV was utilized to reduce the radiation dose to as low as reasonably achievable. COMPARISON: Prior examination of 06/09/2023. CLINICAL HISTORY: Abdominal pain, acute, nonlocalized. Rectal cancer. Status post chemotherapy and radiation therapy following APR. *tracking code: Bo* FINDINGS: LOWER CHEST: No acute abnormality. LIVER: The liver is unremarkable. GALLBLADDER AND BILE DUCTS: Gallbladder is unremarkable. No biliary ductal dilatation. SPLEEN: No acute abnormality. PANCREAS: No acute abnormality. ADRENAL GLANDS: No acute abnormality. KIDNEYS, URETERS AND BLADDER: Surgical changes of cystectomy and ileal conduit formation are identified. There is developed moderate bilateral hydronephrosis and hydroureter to the level of the ureteroenteric anastomosis. The ileal conduit itself is decompressed. 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 There is normal cortical enhancement and preserved cortical thickness. No perinephric inflammatory stranding or fluid collections are identified. As noted above, the ileal conduit itself is decompressed with a urostomy identified within the right mid abdomen. The urinary bladder is surgically absent. GI AND BOWEL: Stomach demonstrates no acute abnormality. A distal small bowel obstruction is present with an abrupt transition identified within  the fecalization of intraluminal contents proximal to the point of obstruction secondary to stasis. Distally, the bowel appears decompressed. Surgical changes of distal proctocolectomy with descending colostomy within the left mid abdomen are identified. Other visualized portions of the small and large bowel are unremarkable. PERITONEUM AND RETROPERITONEUM: No ascites. No free air. VASCULATURE: Decompression of the iliocaval venous structures may reflect intravascular depletion. The abdominal vasculature is otherwise unremarkable. LYMPH NODES: No lymphadenopathy. REPRODUCTIVE ORGANS: The prostate gland is not clearly identified, and changes of prostatectomy are suspected. Infiltrative soft tissue within the operative bed may reflect postsurgical changes/scarring, appears asymmetrically thickened within the left perineal region, similar to prior examination and recurrent or residual disease is not excluded. This is better assessed on prior MRI examination of the pelvis of 06/24/2023. BONES AND SOFT TISSUES: Osseous structures are age-appropriate. No acute bone abnormality. No lytic or blastic bone lesion. Infiltrative soft tissue within the operative bed may reflect postsurgical changes/scarring, appears asymmetrically thickened within the left  perineal region, similar to prior examination and recurrent or residual disease is not excluded. This is better assessed on prior MRI examination of the pelvis of 06/24/2023. IMPRESSION: 1. Distal small bowel obstruction with single transition point within the deep pelvis. 2. Moderate bilateral hydronephrosis and hydroureter to the ureteroenteric anastomoses favoring anastomotic strictures , less likely developing chronic reflux, with a decompressed ileal conduit. 3. Infiltrative soft tissue within the operative bed with asymmetric thickening in the left perineal region, similar to prior, with recurrent or residual disease not excluded. 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 However, if there is evidence of recurring disease (clinical symptoms, laboratory evaluation), PET/CT examination may be more helpful for further evaluation. Electronically signed by: Dorethia Molt MD 07/07/2024 08:27 PM EST RP Workstation: [id number]     Procedures   Medications Ordered in the ED  sodium chloride  0.9 % bolus 1,000 mL (1,000 mLs Intravenous New Bag/Given 07/07/24 1929)  iohexol  (OMNIPAQUE ) 300 MG/ML solution 80 mL (80 mLs Intravenous Contrast Given 07/07/24 1950)   CRITICAL CARE Performed by: Fairy Sermon Total critical care time: 45 minutes Critical care time was exclusive of separately billable procedures and treating other patients. Critical care was necessary to treat or prevent imminent or life-threatening deterioration. Critical care was time spent personally by me on the following activities: development of treatment plan with patient and/or surrogate as well as nursing, discussions with consultants, evaluation of patient's response to treatment, examination of patient, obtaining history from patient or surrogate, ordering and performing treatments and interventions, ordering and review of laboratory studies, ordering and review of radiographic studies, pulse oximetry and re-evaluation of patient's condition.     I spoke to general surgery over [facility].  And they stated they have no beds available.  They recommended calling back in 2 days to see if there is one available if needed                                 Medical Decision Making Amount and/or Complexity of Data Reviewed Labs: ordered. Radiology: ordered.  Risk Prescription drug management. Decision regarding hospitalization.  Patient will be admitted for small bowel obstruction with surgery consult     Final diagnoses:  Small bowel obstruction [facility])    ED Discharge Orders     None          Sermon Fairy, MD 07/08/24  1157

## 2024-07-07 NOTE — H&P (Signed)
 History and Physical  Tanner Dudley FMW:[id number] DOB: 04/15/1966 DOA: 07/07/2024  Referring physician: Dr. Suzette, EDP  PCP: [name], FNP  Outpatient Specialists: General Surgery, urology, plastic surgery. Patient coming from: Home.  Chief Complaint: Abdominal pain, nausea, constipation, no flatulence.  HPI: Tanner Dudley is a 58 y.o. 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  Has not had any air or stool in his colostomy bag for the past 24 hours.  Prior to that was having stools and having to empty his colostomy bag daily.  Associated with diffuse abdominal pain, nausea, and wanting to vomit but unable.  States symptoms are similar to prior SBO.  Symptoms started after eating raw peanuts yesterday afternoon.   In the ER, tachycardic, uncomfortable appearing due to significant abdominal pain and nausea.  Lab studies are remarkable for elevated creatinine 1.87 from baseline of 0.9.  WBC 11.8.   CT abdomen pelvis with contrast revealed distal small bowel obstruction with single transition point within the deep pelvis.  Moderate bilateral hydronephrosis and hydroureter to the ureteroenteric anastomoses favoring anastomosis strictures, less likely developing chronic reflux, with a decompressed ileal conduit.   Discussed the case with general surgery, Dr. Mavis.  The patient had a prolonged postoperative course at [facility].  Recommended to reach out to [facility] to see if they would accept transfer of the patient, given his extensive surgical history with [facility].  EDP, Dr. Zammit, was made aware and initiated transfer to  [facility], however, they have no beds available, likely for the next few days.    Since no beds will be available anytime soon at [facility], general surgery recommended admission.  Admitted by [facility], hospitalist service.    ED Course: Temperature 98.4.  BP 158/96, pulse 80, respiration rate 17, O2 saturation 98 % on room air.  Review of Systems: Review of systems as noted in the HPI. All other systems reviewed and are negative.   Past Medical History:  Diagnosis Date   Allergy    Depression    HOH (hard of hearing)    Medical history non-contributory    Rectal cancer [facility])    Past Surgical History:  Procedure Laterality Date   BIOPSY  09/16/2017   Procedure: BIOPSY;  Surgeon: Shaaron Lamar HERO, MD;  Location: [facility] ENDO SUITE;  Service: Gastroenterology;;  anal rectal   COLONOSCOPY WITH PROPOFOL  N/A 09/16/2017   Procedure: COLONOSCOPY WITH PROPOFOL ;  Surgeon: Shaaron Lamar HERO, MD;  Location: [facility] ENDO [facility];  Service: Gastroenterology;  Laterality: N/A;   COLOSTOMY N/A 10/09/2017   Procedure: DIVERTING LOOP COLOSTOMY (PALLATIVE);  Surgeon: [name], MD;  Location: [facility] ORS;  Service: General;  Laterality: N/A;   None      Social History:  reports that he has been smoking cigarettes. He started smoking about 26 years ago. He has a 13.4 pack-year smoking history. He has never used smokeless tobacco. He reports that he does not currently use alcohol. He reports that he does not use drugs.   No Known Allergies  Family History  Problem Relation Age of Onset   Colon cancer Father        diagnosed in his 29s, succumbed to disease   Diabetes Mother  Heart attack Mother    Hypertension Mother    Pancreatic cancer Sister    Prostate cancer Brother    Melanoma Brother    Inflammatory bowel disease Neg Hx    Colon polyps Neg Hx       Prior to Admission medications   Medication Sig Start Date End Date Taking? Authorizing Provider  amLODipine  (NORVASC ) 5 MG tablet Take 1 tablet (5 mg  total) by mouth daily. 02/15/24   [name], FNP  escitalopram  (LEXAPRO ) 20 MG tablet Take 1 tablet (20 mg total) by mouth daily. 02/15/24   [name], FNP  hydrochlorothiazide  (HYDRODIURIL ) 12.5 MG tablet Take 1 tablet (12.5 mg total) by mouth daily. 02/15/24   [name], FNP    Physical Exam: BP (!) 158/96 (BP Location: Right Arm)   Pulse 80   Temp 98.4 F (36.9 C)   Resp 17   Ht 5' 7 (1.702 m)   Wt 96.5 kg   SpO2 98%   BMI 33.32 kg/m   General: 58 y.o. year-old male well developed well nourished in no acute distress.  Alert and oriented x3. Cardiovascular: Regular rate and rhythm with no rubs or gallops.  No thyromegaly or JVD noted.  No lower extremity edema. 2/4 pulses in all 4 extremities. Respiratory: Clear to auscultation with no wheezes or rales. Good inspiratory effort. Abdomen: Distended, ostomy bag in place with no stools or air in it.  Hypoactive bowel sounds x4 quadrants. Muskuloskeletal: No cyanosis, clubbing or edema noted bilaterally Neuro: CN II-XII intact, strength, sensation, reflexes Skin: No ulcerative lesions noted or rashes Psychiatry: Judgement and insight appear normal. Mood is appropriate for condition and setting          Labs on Admission:  Basic Metabolic Panel: Recent Labs  Lab 07/07/24 1725  NA 139  K 4.5  CL 98  CO2 23  GLUCOSE 137*  BUN 28*  CREATININE 1.87*  CALCIUM 11.0*   Liver Function Tests: Recent Labs  Lab 07/07/24 1725  AST 23  ALT 15  ALKPHOS 70  BILITOT 0.7  PROT 9.0*  ALBUMIN 5.0   Recent Labs  Lab 07/07/24 1725  LIPASE 20   No results for input(s): AMMONIA in the last 168 hours. CBC: Recent Labs  Lab 07/07/24 1725  WBC 11.8*  HGB 17.3*  HCT 51.4  MCV 90.5  PLT 318   Cardiac Enzymes: No results for input(s): CKTOTAL, CKMB, CKMBINDEX, TROPONINI in the last 168 hours.  BNP (last 3 results) No results for input(s): BNP in the last 8760 hours.  ProBNP (last 3 results) No  results for input(s): PROBNP in the last 8760 hours.  CBG: No results for input(s): GLUCAP in the last 168 hours.  Radiological Exams on Admission: CT ABDOMEN PELVIS W CONTRAST Result Date: 07/07/2024 EXAM: CT ABDOMEN AND PELVIS WITH CONTRAST 07/07/2024 08:11:24 PM TECHNIQUE: CT of the abdomen and pelvis was performed with the administration of 80 mL of iohexol  (OMNIPAQUE ) 300 MG/ML solution. Multiplanar reformatted images are provided for review. Automated exposure control, iterative reconstruction, and/or weight-based adjustment of the mA/kV was utilized to reduce the radiation dose to as low as reasonably achievable. COMPARISON: Prior examination of 06/09/2023. CLINICAL HISTORY: Abdominal pain, acute, nonlocalized. Rectal cancer. Status post chemotherapy and radiation therapy following APR. *tracking code: Bo* FINDINGS: LOWER CHEST: No acute abnormality. LIVER: The liver is unremarkable. GALLBLADDER AND BILE DUCTS: Gallbladder is unremarkable. No biliary ductal dilatation. SPLEEN: No acute abnormality. PANCREAS: No acute abnormality. ADRENAL GLANDS: No acute abnormality.  KIDNEYS, URETERS AND BLADDER: Surgical changes of cystectomy and ileal conduit formation are identified. There is developed moderate bilateral hydronephrosis and hydroureter to the level of the ureteroenteric anastomosis. The ileal conduit itself is decompressed. 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 There is normal cortical enhancement and preserved cortical thickness. No perinephric inflammatory stranding or fluid collections are identified. As noted above, the ileal conduit itself is decompressed with a urostomy identified within the right mid abdomen. The urinary bladder is surgically absent. GI AND BOWEL: Stomach demonstrates no acute abnormality. A distal small bowel obstruction is present with an abrupt  transition identified within the fecalization of intraluminal contents proximal to the point of obstruction secondary to stasis. Distally, the bowel appears decompressed. Surgical changes of distal proctocolectomy with descending colostomy within the left mid abdomen are identified. Other visualized portions of the small and large bowel are unremarkable. PERITONEUM AND RETROPERITONEUM: No ascites. No free air. VASCULATURE: Decompression of the iliocaval venous structures may reflect intravascular depletion. The abdominal vasculature is otherwise unremarkable. LYMPH NODES: No lymphadenopathy. REPRODUCTIVE ORGANS: The prostate gland is not clearly identified, and changes of prostatectomy are suspected. Infiltrative soft tissue within the operative bed may reflect postsurgical changes/scarring, appears asymmetrically thickened within the left perineal region, similar to prior examination and recurrent or residual disease is not excluded. This is better assessed on prior MRI examination of the pelvis of 06/24/2023. BONES AND SOFT TISSUES: Osseous structures are age-appropriate. No acute bone abnormality. No lytic or blastic bone lesion. Infiltrative soft tissue within the operative bed may reflect postsurgical changes/scarring, appears asymmetrically thickened within the left perineal region, similar to prior examination and recurrent or residual disease is not excluded. This is better assessed on prior MRI examination of the pelvis of 06/24/2023. IMPRESSION: 1. Distal small bowel obstruction with single transition point within the deep pelvis. 2. Moderate bilateral hydronephrosis and hydroureter to the ureteroenteric anastomoses favoring anastomotic strictures , less likely developing chronic reflux, with a decompressed ileal conduit. 3. Infiltrative soft tissue within the operative bed with asymmetric thickening in the left perineal region, similar to prior, with recurrent or residual disease not excluded. 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 However, if there is evidence of recurring disease (clinical symptoms, laboratory evaluation), PET/CT examination may be more helpful for further evaluation. Electronically signed by: Dorethia Molt MD 07/07/2024 08:27 PM EST RP Workstation: [id number]    EKG: I independently viewed the EKG done and my findings are as followed: None available at the time of this visit.  Assessment/Plan Present on Admission:  SBO (small bowel obstruction) (HCC)  Principal Problem:   SBO (small bowel obstruction) (HCC)  SBO History of prior SBO Extensive abdominal surgeries done at [facility] with prolonged postoperative course Continue supportive care LR at 100 cc/h x 1 day Optimize magnesium and potassium levels Early mobilization Pain control as needed and IV antiemetics as needed SBO protocol NG tube placement with Gastrografin Repeat abdominal x-ray in the morning N.p.o. except ice chips.   AKI, suspect multifactorial Baseline creatinine 0.9 with GFR greater than 60 Presented with creatinine 1.87 with GFR 42, BUN 28. IV fluid hydration LR 100 cc/h x 1 day Bilateral hydronephrosis seen on CT scan He also has Urologic findings that are concerning for anastomotic strictures.  Monitor urine output Repeat BMP in the morning.   Hypertension, BP is not at goal,  elevated Hold off home oral antihypertensives in the setting of SBO IV antihypertensives as needed with parameters Closely monitor vital signs.   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 Continue IV fluid hydration Recommend outpatient follow-up with medical oncology   Obesity BMI 33 Recommend weight loss outpatient with regular physical activity and healthy diet.   Time: 55 minutes.   DVT prophylaxis: Subcu Lovenox  daily.  Code Status: Full code.  Family Communication: The patient's brother at  bedside.  Disposition Plan: Admitted to MedSurg unit.  Consults called: General Surgery consulted by EDP.  Admission status: Inpatient status.   Status is: Inpatient The patient requires at least 2 midnights for further evaluation and treatment of present condition.   Terry LOISE Hurst MD [facility] Pager [phone]  If 7PM-7AM, please contact night-coverage [email or web address] Password [email or web address]  07/07/2024, 10:59 PM

## 2024-07-07 NOTE — ED Triage Notes (Signed)
 Pt arrived via POV c/o generalized abdominal pain that began last night around 1900. Pt presents with an ostomy and colostomy and reports he ate raw peanuts yesterday morning before the pain began. Pt also reports that today he began experiencing chills and sweating more than usual.

## 2024-07-08 ENCOUNTER — Inpatient Hospital Stay (HOSPITAL_COMMUNITY)

## 2024-07-08 DIAGNOSIS — K56609 Unspecified intestinal obstruction, unspecified as to partial versus complete obstruction: Secondary | ICD-10-CM | POA: Diagnosis not present

## 2024-07-08 LAB — BASIC METABOLIC PANEL WITH GFR
Anion gap: 16 — ABNORMAL HIGH (ref 5–15)
BUN: 28 mg/dL — ABNORMAL HIGH (ref 6–20)
CO2: 22 mmol/L (ref 22–32)
Calcium: 9.6 mg/dL (ref 8.9–10.3)
Chloride: 103 mmol/L (ref 98–111)
Creatinine, Ser: 1.85 mg/dL — ABNORMAL HIGH (ref 0.61–1.24)
GFR, Estimated: 42 mL/min — ABNORMAL LOW (ref >60)
Glucose, Bld: 122 mg/dL — ABNORMAL HIGH (ref 70–99)
Potassium: 4.3 mmol/L (ref 3.5–5.1)
Sodium: 140 mmol/L (ref 135–145)

## 2024-07-08 LAB — CBC WITH DIFFERENTIAL/PLATELET
Abs Immature Granulocytes: 0.04 K/uL (ref 0.00–0.07)
Basophils Absolute: 0 K/uL (ref 0.0–0.1)
Basophils Relative: 0 %
Eosinophils Absolute: 0 K/uL (ref 0.0–0.5)
Eosinophils Relative: 0 %
HCT: 45.2 % (ref 39.0–52.0)
Hemoglobin: 15.4 g/dL (ref 13.0–17.0)
Immature Granulocytes: 0 %
Lymphocytes Relative: 8 %
Lymphs Abs: 0.7 K/uL (ref 0.7–4.0)
MCH: 30.7 pg (ref 26.0–34.0)
MCHC: 34.1 g/dL (ref 30.0–36.0)
MCV: 90 fL (ref 80.0–100.0)
Monocytes Absolute: 0.9 K/uL (ref 0.1–1.0)
Monocytes Relative: 10 %
Neutro Abs: 7.6 K/uL (ref 1.7–7.7)
Neutrophils Relative %: 82 %
Platelets: 290 K/uL (ref 150–400)
RBC: 5.02 MIL/uL (ref 4.22–5.81)
RDW: 13.7 % (ref 11.5–15.5)
WBC: 9.3 K/uL (ref 4.0–10.5)
nRBC: 0 % (ref 0.0–0.2)

## 2024-07-08 LAB — PHOSPHORUS: Phosphorus: 4.3 mg/dL (ref 2.5–4.6)

## 2024-07-08 LAB — MAGNESIUM: Magnesium: 2.6 mg/dL — ABNORMAL HIGH (ref 1.7–2.4)

## 2024-07-08 LAB — HIV ANTIBODY (ROUTINE TESTING W REFLEX): HIV Screen 4th Generation wRfx: NONREACTIVE

## 2024-07-08 MED ORDER — SODIUM CHLORIDE 0.9% FLUSH: 10.0000 mL | INTRAVENOUS | Status: DC | PRN

## 2024-07-08 MED ORDER — SODIUM CHLORIDE 0.9% FLUSH
10.0000 mL | Freq: Two times a day (BID) | INTRAVENOUS | Status: DC
  Administered 2024-07-08 – 2024-07-09 (×3): 10 mL

## 2024-07-08 MED ORDER — IOHEXOL 300 MG/ML  SOLN: 100.0000 mL | Freq: Once | INTRAMUSCULAR | Status: AC | PRN

## 2024-07-08 MED ORDER — POLYETHYLENE GLYCOL 3350 17 G PO PACK
17.0000 g | PACK | Freq: Two times a day (BID) | ORAL | Status: DC
  Filled 2024-07-08 (×2): qty 1

## 2024-07-08 MED ORDER — CHLORHEXIDINE GLUCONATE CLOTH 2 % EX PADS
6.0000 | MEDICATED_PAD | Freq: Every day | CUTANEOUS | Status: DC
  Administered 2024-07-08: 6 via TOPICAL

## 2024-07-08 NOTE — Plan of Care (Signed)
  Problem: Education: Goal: Knowledge of General Education information will improve Description: Including pain rating scale, medication(s)/side effects and non-pharmacologic comfort measures Outcome: Progressing   Problem: Pain Managment: Goal: General experience of comfort will improve and/or be controlled Outcome: Progressing

## 2024-07-08 NOTE — Progress Notes (Signed)
 Consulted in to transfer to [facility] for surgical consult   07/08/24 0700  TOC Brief Assessment  Insurance and Status Reviewed  Patient has primary care physician Yes  Home environment has been reviewed Home  Prior level of function: independent  Prior/Current Home Services No current home services  Social Drivers of Health Review SDOH reviewed no interventions necessary  Readmission risk has been reviewed Yes  Transition of care needs no transition of care needs at this time   Inpatient Care Manager (ICM) has reviewed patient and no ICM needs have been identified at this time. We will continue to monitor patient advancement through interdisciplinary progression rounds. If new patient transition needs arise, please place a ICM consult.

## 2024-07-08 NOTE — Plan of Care (Signed)

## 2024-07-08 NOTE — Consult Note (Addendum)
 Reason for Consult: Small bowel obstruction Referring Physician: Dr. Dino Maryalice Fell is an 58 y.o. male.  HPI: Patient is a 58 year old black male with an extensive surgical history for treatment of rectal carcinoma including an diverting colostomy, abdominal perineal resection, ileal conduit formation, myocutaneous flaps for closure of his perineum with prolonged wound care and hospitalization at [facility] who presented to [facility] with worsening nausea and vomiting.  He had a decrease in his colostomy output.  He states he has had this before once at [facility] but this was treated conservatively.  CT scan of the abdomen pelvis had multiple findings including a small bowel obstruction secondary most likely to adhesive disease.  Other findings includ included possible bilateral hydronephrosis.  He also has abnormal thickening in the left perineal region which has been present in the past.  He has transferred his oncology care to [facility].  We attempted to transfer the patient to [facility], but no beds were available.  He was admitted to the hospital with an NG tube in place.  Overnight, he ended up having return of his bowel function with needing to change his ostomy bag.  He states his abdomen is much softer and he has no abdominal pain.  Past Medical History:  Diagnosis Date   Allergy    Depression    HOH (hard of hearing)    Medical history non-contributory    Rectal cancer [facility])     Past Surgical History:  Procedure Laterality Date   BIOPSY  09/16/2017   Procedure: BIOPSY;  Surgeon: Shaaron Lamar HERO, MD;  Location: [facility];  Service: Gastroenterology;;  anal rectal   COLONOSCOPY WITH PROPOFOL  N/A 09/16/2017   Procedure: COLONOSCOPY WITH PROPOFOL ;  Surgeon: Shaaron Lamar HERO, MD;  Location: [facility];  Service: Gastroenterology;  Laterality: N/A;   COLOSTOMY N/A 10/09/2017   Procedure: DIVERTING LOOP COLOSTOMY (PALLATIVE);  Surgeon: [name],  MD;  Location: [facility] ORS;  Service: General;  Laterality: N/A;   None      Family History  Problem Relation Age of Onset   Colon cancer Father        diagnosed in his 65s, succumbed to disease   Diabetes Mother    Heart attack Mother    Hypertension Mother    Pancreatic cancer Sister    Prostate cancer Brother    Melanoma Brother    Inflammatory bowel disease Neg Hx    Colon polyps Neg Hx     Social History:  reports that he has been smoking cigarettes. He started smoking about 26 years ago. He has a 13.4 pack-year smoking history. He has never used smokeless tobacco. He reports that he does not currently use alcohol. He reports that he does not use drugs.  Allergies: Not on File  Medications: I have reviewed the patient's current medications. Prior to Admission:  Medications Prior to Admission  Medication Sig Dispense Refill Last Dose/Taking   amLODipine  (NORVASC ) 5 MG tablet Take 1 tablet (5 mg total) by mouth daily. 90 tablet 2 07/07/2024 Morning   escitalopram  (LEXAPRO ) 20 MG tablet Take 1 tablet (20 mg total) by mouth daily. 90 tablet 2 07/07/2024 Morning   hydrochlorothiazide  (HYDRODIURIL ) 12.5 MG tablet Take 1 tablet (12.5 mg total) by mouth daily. 90 tablet 2 Past Week    Results for orders placed or performed during the hospital encounter of 07/07/24 (from the past 48 hours)  Urinalysis, Routine w reflex microscopic -Urine, Clean  Catch     Status: Abnormal   Collection Time: 07/07/24  4:47 PM  Result Value Ref Range   Color, Urine YELLOW YELLOW   APPearance HAZY (A) CLEAR   Specific Gravity, Urine 1.011 1.005 - 1.030   pH 6.0 5.0 - 8.0   Glucose, UA NEGATIVE NEGATIVE mg/dL   Hgb urine dipstick NEGATIVE NEGATIVE   Bilirubin Urine NEGATIVE NEGATIVE   Ketones, ur NEGATIVE NEGATIVE mg/dL   Protein, ur NEGATIVE NEGATIVE mg/dL   Nitrite NEGATIVE NEGATIVE   Leukocytes,Ua NEGATIVE NEGATIVE    Comment: Performed at [facility], [address] [place], [place] [address]   Lipase, blood     Status: None   Collection Time: 07/07/24  5:25 PM  Result Value Ref Range   Lipase 20 11 - 51 U/L    Comment: Performed at [facility], [address] [place], [place] [address]  Comprehensive metabolic panel     Status: Abnormal   Collection Time: 07/07/24  5:25 PM  Result Value Ref Range   Sodium 139 135 - 145 mmol/L   Potassium 4.5 3.5 - 5.1 mmol/L   Chloride 98 98 - 111 mmol/L   CO2 23 22 - 32 mmol/L   Glucose, Bld 137 (H) 70 - 99 mg/dL    Comment: Glucose reference range applies only to samples taken after fasting for at least 8 hours.   BUN 28 (H) 6 - 20 mg/dL   Creatinine, Ser 8.12 (H) 0.61 - 1.24 mg/dL   Calcium 88.9 (H) 8.9 - 10.3 mg/dL   Total Protein 9.0 (H) 6.5 - 8.1 g/dL   Albumin 5.0 3.5 - 5.0 g/dL   AST 23 15 - 41 U/L   ALT 15 0 - 44 U/L   Alkaline Phosphatase 70 38 - 126 U/L   Total Bilirubin 0.7 0.0 - 1.2 mg/dL   GFR, Estimated 42 (L) >60 mL/min    Comment: (NOTE) Calculated using the CKD-EPI Creatinine Equation (2021)    Anion gap 19 (H) 5 - 15    Comment: Performed at [facility], [address], [place], [place] [address]  CBC     Status: Abnormal   Collection Time: 07/07/24  5:25 PM  Result Value Ref Range   WBC 11.8 (H) 4.0 - 10.5 K/uL   RBC 5.68 4.22 - 5.81 MIL/uL   Hemoglobin 17.3 (H) 13.0 - 17.0 g/dL   HCT 48.5 60.9 - 47.9 %   MCV 90.5 80.0 - 100.0 fL   MCH 30.5 26.0 - 34.0 pg   MCHC 33.7 30.0 - 36.0 g/dL   RDW 86.2 88.4 - 84.4 %   Platelets 318 150 - 400 K/uL   nRBC 0.0 0.0 - 0.2 %    Comment: Performed at [facility], [address], [place], [place] [address]  CBC with Differential/Platelet     Status: None   Collection Time: 07/08/24  4:47 AM  Result Value Ref Range   WBC 9.3 4.0 - 10.5 K/uL   RBC 5.02 4.22 - 5.81 MIL/uL   Hemoglobin 15.4 13.0 - 17.0 g/dL   HCT 54.7 60.9 - 47.9 %   MCV 90.0 80.0 - 100.0 fL   MCH 30.7 26.0 - 34.0 pg   MCHC 34.1 30.0 - 36.0 g/dL   RDW 86.2 88.4 - 84.4 %   Platelets 290 150 - 400  K/uL   nRBC 0.0 0.0 - 0.2 %   Neutrophils Relative % 82 %   Neutro Abs 7.6 1.7 - 7.7 K/uL  Lymphocytes Relative 8 %   Lymphs Abs 0.7 0.7 - 4.0 K/uL   Monocytes Relative 10 %   Monocytes Absolute 0.9 0.1 - 1.0 K/uL   Eosinophils Relative 0 %   Eosinophils Absolute 0.0 0.0 - 0.5 K/uL   Basophils Relative 0 %   Basophils Absolute 0.0 0.0 - 0.1 K/uL   Immature Granulocytes 0 %   Abs Immature Granulocytes 0.04 0.00 - 0.07 K/uL    Comment: Performed at [facility], [address] [place], [place] [address]  Basic metabolic panel     Status: Abnormal   Collection Time: 07/08/24  4:47 AM  Result Value Ref Range   Sodium 140 135 - 145 mmol/L   Potassium 4.3 3.5 - 5.1 mmol/L   Chloride 103 98 - 111 mmol/L   CO2 22 22 - 32 mmol/L   Glucose, Bld 122 (H) 70 - 99 mg/dL    Comment: Glucose reference range applies only to samples taken after fasting for at least 8 hours.   BUN 28 (H) 6 - 20 mg/dL   Creatinine, Ser 8.14 (H) 0.61 - 1.24 mg/dL   Calcium 9.6 8.9 - 89.6 mg/dL   GFR, Estimated 42 (L) >60 mL/min    Comment: (NOTE) Calculated using the CKD-EPI Creatinine Equation (2021)    Anion gap 16 (H) 5 - 15    Comment: Performed at [facility], [address] [place], [place] [address]  Magnesium     Status: Abnormal   Collection Time: 07/08/24  4:47 AM  Result Value Ref Range   Magnesium 2.6 (H) 1.7 - 2.4 mg/dL    Comment: Performed at [facility], [address] [place], [place] [address]  Phosphorus     Status: None   Collection Time: 07/08/24  4:47 AM  Result Value Ref Range   Phosphorus 4.3 2.5 - 4.6 mg/dL    Comment: Performed at [facility], [address], [place], [place] [address]    DG Abd Portable 1V Result Date: 07/08/2024 EXAM: 1 VIEW XRAY OF THE ABDOMEN 07/08/2024 02:15:00 AM COMPARISON: 07/07/2024 CLINICAL HISTORY: SBO (small bowel obstruction) (HCC) FINDINGS: LINES, TUBES AND DEVICES: Enteric tube coursing below the hemidiaphragm with tip and side port overlying the  expected region of the gastric lumen. BOWEL: Contrast opacifies gastric lumen on the second image. Nonobstructive bowel gas pattern. SOFT TISSUES: Surgical clips overlying the pelvis. No abnormal calcifications. Contrast within the renal collecting systems and ureters. BONES: No acute fracture. IMPRESSION: 1. No acute findings. Electronically signed by: Franky Stanford MD 07/08/2024 02:53 AM EST RP Workstation: [id number]   DG Abd Portable 1V-Small Bowel Protocol-Position Verification Result Date: 07/07/2024 EXAM: 1 VIEW XRAY OF THE ABDOMEN 07/07/2024 11:40:00 PM COMPARISON: None available. CLINICAL HISTORY: Encounter for imaging study to confirm nasogastric (NG) tube placement. FINDINGS: LINES, TUBES AND DEVICES: Enteric tube in place with tip and side port projecting over the stomach. Left chest wall port in place with tip overlying the expected area of the superior cavoatrial junction. BOWEL: Nonobstructive bowel gas pattern. SOFT TISSUES: No abnormal calcifications. BONES: No acute fracture. LUNGS: Lower lung volumes with interstitial coarsening and patchy bilateral airspace opacities. IMPRESSION: 1. Enteric tube in appropriate position. 2. Low lung volumes with interstitial coarsening and patchy bilateral airspace opacities. Electronically signed by: Norman Gatlin MD 07/07/2024 11:49 PM EST RP Workstation: [id number]   CT ABDOMEN PELVIS W CONTRAST Result Date: 07/07/2024 EXAM: CT ABDOMEN AND PELVIS WITH CONTRAST 07/07/2024 08:11:24 PM TECHNIQUE: CT of the abdomen and pelvis was performed  with the administration of 80 mL of iohexol  (OMNIPAQUE ) 300 MG/ML solution. Multiplanar reformatted images are provided for review. Automated exposure control, iterative reconstruction, and/or weight-based adjustment of the mA/kV was utilized to reduce the radiation dose to as low as reasonably achievable. COMPARISON: Prior examination of 06/09/2023. CLINICAL HISTORY: Abdominal pain, acute, nonlocalized. Rectal cancer.  Status post chemotherapy and radiation therapy following APR. *tracking code: Bo* FINDINGS: LOWER CHEST: No acute abnormality. LIVER: The liver is unremarkable. GALLBLADDER AND BILE DUCTS: Gallbladder is unremarkable. No biliary ductal dilatation. SPLEEN: No acute abnormality. PANCREAS: No acute abnormality. ADRENAL GLANDS: No acute abnormality. KIDNEYS, URETERS AND BLADDER: Surgical changes of cystectomy and ileal conduit formation are identified. There is developed moderate bilateral hydronephrosis and hydroureter to the level of the ureteroenteric anastomosis. The ileal conduit itself is decompressed. 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 There is normal cortical enhancement and preserved cortical thickness. No perinephric inflammatory stranding or fluid collections are identified. As noted above, the ileal conduit itself is decompressed with a urostomy identified within the right mid abdomen. The urinary bladder is surgically absent. GI AND BOWEL: Stomach demonstrates no acute abnormality. A distal small bowel obstruction is present with an abrupt transition identified within the fecalization of intraluminal contents proximal to the point of obstruction secondary to stasis. Distally, the bowel appears decompressed. Surgical changes of distal proctocolectomy with descending colostomy within the left mid abdomen are identified. Other visualized portions of the small and large bowel are unremarkable. PERITONEUM AND RETROPERITONEUM: No ascites. No free air. VASCULATURE: Decompression of the iliocaval venous structures may reflect intravascular depletion. The abdominal vasculature is otherwise unremarkable. LYMPH NODES: No lymphadenopathy. REPRODUCTIVE ORGANS: The prostate gland is not clearly identified, and changes of prostatectomy are suspected. Infiltrative soft tissue within the operative  bed may reflect postsurgical changes/scarring, appears asymmetrically thickened within the left perineal region, similar to prior examination and recurrent or residual disease is not excluded. This is better assessed on prior MRI examination of the pelvis of 06/24/2023. BONES AND SOFT TISSUES: Osseous structures are age-appropriate. No acute bone abnormality. No lytic or blastic bone lesion. Infiltrative soft tissue within the operative bed may reflect postsurgical changes/scarring, appears asymmetrically thickened within the left perineal region, similar to prior examination and recurrent or residual disease is not excluded. This is better assessed on prior MRI examination of the pelvis of 06/24/2023. IMPRESSION: 1. Distal small bowel obstruction with single transition point within the deep pelvis. 2. Moderate bilateral hydronephrosis and hydroureter to the ureteroenteric anastomoses favoring anastomotic strictures , less likely developing chronic reflux, with a decompressed ileal conduit. 3. Infiltrative soft tissue within the operative bed with asymmetric thickening in the left perineal region, similar to prior, with recurrent or residual disease not excluded. 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 However, if there is evidence of recurring disease (clinical symptoms, laboratory evaluation), PET/CT examination may be more helpful for further evaluation. Electronically signed by: Dorethia Molt MD 07/07/2024 08:27 PM EST RP Workstation: [id number]    ROS:  Pertinent items are noted in HPI.  Blood pressure (!) 131/101, pulse 87, temperature 98.3 F (36.8 C), resp. rate 18, height 5' 7 (1.702 m), weight 93.4 kg, SpO2 93%. Physical Exam: Pleasant well-developed well-nourished black male no acute distress Head is normocephalic, atraumatic Lungs clear to auscultation with equal breath sounds bilaterally Heart examination reveals regular rate and  rhythm  without S3, S4, murmurs Abdomen is soft with an ileal conduit bag on the right side and a colostomy bag on the left side.  The colostomy bag has stool and air in it.  Bowel sounds are present.  Well-healed surgical scars are present.  CT scan images personally reviewed  Assessment/Plan: Impression: Small bowel obstruction most likely secondary to adhesive disease, resolving. Plan: Have removed NG tube and will start full liquid diet.  May advance to soft diet this evening if he is doing well.  No need for acute surgical invention at the present time.  Will follow with you.  Continue IV fluid hydration.  Oneil Budge 07/08/2024, 10:29 AM

## 2024-07-08 NOTE — Progress Notes (Signed)
 PROGRESS NOTE    Tanner Dudley  FMW:[id number] DOB: 12/14/1965 DOA: 07/07/2024 PCP: [name], FNP   Brief Narrative:    58 y.o. 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  CT abdomen pelvis with contrast revealed distal small bowel obstruction with single transition point within the deep pelvis. Moderate bilateral hydronephrosis and hydroureter to the ureteroenteric anastomoses favoring anastomosis strictures, less likely developing chronic reflux, with a decompressed ileal conduit.   General surgery consulted.  Initial plan was to transfer to [facility] but they did not have any available beds so patient was subsequently admitted to [facility].  NG tube in place, he is now having bowel movements.  He is currently on intravenous fluids and general surgery is on board for management of SBO.  Assessment & Plan:  Principal Problem:   SBO (small bowel obstruction) (HCC)   SBO History of prior SBO Extensive abdominal surgeries done at [facility] with prolonged postoperative course Continue supportive care LR at 100 cc/h x 1 day 7 bowel movements now Early mobilization Pain control as needed and IV antiemetics as needed SBO protocol NG tube is in place Repeat abdominal x-ray in the morning N.p.o. except ice chips.   AKI, suspect multifactorial Baseline creatinine 0.9 with GFR greater than 60 Presented with creatinine 1.87 with GFR 42, BUN 28. IV fluid hydration LR 100 cc/h x 1 day Bilateral hydronephrosis seen on CT scan He also has Urologic findings that are concerning for anastomotic strictures.  Monitor urine output Repeat BMP in  the morning. He will need outpatient follow-up with urology   Essential hypertension, POA: Hold off home oral antihypertensives in the setting of SBO IV antihypertensives as needed with parameters Closely monitor vital signs.   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 Continue IV fluid hydration Recommend outpatient follow-up with medical oncology   Obesity BMI 33 Recommend weight loss outpatient with regular physical activity and healthy diet.  Disposition: Patient lives wi is in place th her sister at her trailer.   DVT prophylaxis: enoxaparin  (LOVENOX ) injection 40 mg Start: 07/08/24 1000     Code Status: Full Code Family Communication: None at the bedside Status is: Inpatient Remains inpatient appropriate because: Small bowel obstruction    Subjective:  He said that he did have a bowel movement this morning.  He has a colostomy in place which was placed almost 6 years back.  He said that before that, his last bowel movement was on Tuesday.  He denies any nausea or vomiting.  NG tube is in place and he is currently on intravenous fluids.  Examination:  General exam: Appears calm and comfortable  Respiratory system: Clear to auscultation. Respiratory effort normal. Cardiovascular system: S1 & S2 heard, RRR. No JVD, murmurs, rubs, gallops or clicks. No pedal edema. Gastrointestinal system: Abdomen is nondistended, soft and nontender. No organomegaly or masses felt.  Colostomy in place containing brownish liquid Central nervous system: Alert and oriented. No focal neurological deficits. Extremities: Symmetric 5 x 5 power. Skin: No rashes, lesions or ulcers Psychiatry: Judgement and insight appear normal. Mood & affect appropriate.    Diet Orders (From admission, onward)     Start     Ordered   07/07/24 2248  Diet NPO time specified Except for: Ice Chips  Diet effective now       Question:  Except for  Answer:  Ice Chips   07/07/24 2247             Objective: Vitals:   07/08/24 0148 07/08/24 0411 07/08/24 0451 07/08/24 0539  BP: (!) 146/90 (!) 169/105  (!) 131/101  Pulse:  (!) 101 89 87  Resp:  18    Temp:  98.3 F (36.8 C)    TempSrc:      SpO2:  (!) 89% 93%   Weight:      Height:        Intake/Output Summary (Last 24 hours) at 07/08/2024 0826 Last data filed at 07/08/2024 0148 Gross per 24 hour  Intake 1078.61 ml  Output --  Net 1078.61 ml   Filed Weights   07/07/24 1639 07/08/24 0016  Weight: 96.5 kg 93.4 kg    Scheduled Meds:  Chlorhexidine  Gluconate Cloth  6 each Topical Daily   enoxaparin  (LOVENOX ) injection  40 mg Subcutaneous Q24H   sodium chloride  flush  10-40 mL Intracatheter Q12H   Continuous Infusions:  lactated ringers  100 mL/hr at 07/08/24 0148    Nutritional status     Body mass index is 32.25 kg/m.  Data Reviewed:   CBC: Recent Labs  Lab 07/07/24 1725 07/08/24 0447  WBC 11.8* 9.3  NEUTROABS  --  7.6  HGB 17.3* 15.4  HCT 51.4 45.2  MCV 90.5 90.0  PLT 318 290   Basic Metabolic Panel: Recent Labs  Lab 07/07/24 1725 07/08/24 0447  NA 139 140  K 4.5 4.3  CL 98 103  CO2 23 22  GLUCOSE 137* 122*  BUN 28* 28*  CREATININE 1.87* 1.85*  CALCIUM 11.0* 9.6  MG  --  2.6*  PHOS  --  4.3   GFR: Estimated Creatinine Clearance: 48.6 mL/min (A) (by C-G formula based on SCr of 1.85 mg/dL (H)). Liver Function Tests: Recent Labs  Lab 07/07/24 1725  AST 23  ALT 15  ALKPHOS 70  BILITOT 0.7  PROT 9.0*  ALBUMIN 5.0   Recent Labs  Lab 07/07/24 1725  LIPASE 20   No results for input(s): AMMONIA in the last 168 hours. Coagulation Profile: No results for input(s): INR, PROTIME in the last 168 hours. Cardiac Enzymes: No results for input(s): CKTOTAL, CKMB, CKMBINDEX, TROPONINI in the last 168 hours. BNP (last 3 results) No results for input(s): PROBNP in the last 8760 hours. HbA1C: No results for input(s): HGBA1C in the last 72 hours. CBG: No  results for input(s): GLUCAP in the last 168 hours. Lipid Profile: No results for input(s): CHOL, HDL, LDLCALC, TRIG, CHOLHDL, LDLDIRECT in the last 72 hours. Thyroid Function Tests: No results for input(s): TSH, T4TOTAL, FREET4, T3FREE, THYROIDAB in the last 72 hours. Anemia Panel: No results for input(s): VITAMINB12, FOLATE, FERRITIN, TIBC, IRON, RETICCTPCT in the last 72 hours. Sepsis Labs: No results for input(s): PROCALCITON, LATICACIDVEN in the last 168 hours.  No results found for this or any previous visit (from the past 240 hours).       Radiology Studies: DG Abd Portable 1V Result Date: 07/08/2024 EXAM: 1 VIEW XRAY OF THE ABDOMEN 07/08/2024 02:15:00 AM COMPARISON: 07/07/2024 CLINICAL HISTORY: SBO (small bowel obstruction) (HCC) FINDINGS: LINES, TUBES AND DEVICES: Enteric tube coursing below the hemidiaphragm with tip and side port overlying the expected region of the gastric lumen. BOWEL: Contrast opacifies gastric lumen on the second image. Nonobstructive bowel gas  pattern. SOFT TISSUES: Surgical clips overlying the pelvis. No abnormal calcifications. Contrast within the renal collecting systems and ureters. BONES: No acute fracture. IMPRESSION: 1. No acute findings. Electronically signed by: Franky Stanford MD 07/08/2024 02:53 AM EST RP Workstation: [id number]   DG Abd Portable 1V-Small Bowel Protocol-Position Verification Result Date: 07/07/2024 EXAM: 1 VIEW XRAY OF THE ABDOMEN 07/07/2024 11:40:00 PM COMPARISON: None available. CLINICAL HISTORY: Encounter for imaging study to confirm nasogastric (NG) tube placement. FINDINGS: LINES, TUBES AND DEVICES: Enteric tube in place with tip and side port projecting over the stomach. Left chest wall port in place with tip overlying the expected area of the superior cavoatrial junction. BOWEL: Nonobstructive bowel gas pattern. SOFT TISSUES: No abnormal calcifications. BONES: No acute fracture. LUNGS: Lower  lung volumes with interstitial coarsening and patchy bilateral airspace opacities. IMPRESSION: 1. Enteric tube in appropriate position. 2. Low lung volumes with interstitial coarsening and patchy bilateral airspace opacities. Electronically signed by: Norman Gatlin MD 07/07/2024 11:49 PM EST RP Workstation: [id number]   CT ABDOMEN PELVIS W CONTRAST Result Date: 07/07/2024 EXAM: CT ABDOMEN AND PELVIS WITH CONTRAST 07/07/2024 08:11:24 PM TECHNIQUE: CT of the abdomen and pelvis was performed with the administration of 80 mL of iohexol  (OMNIPAQUE ) 300 MG/ML solution. Multiplanar reformatted images are provided for review. Automated exposure control, iterative reconstruction, and/or weight-based adjustment of the mA/kV was utilized to reduce the radiation dose to as low as reasonably achievable. COMPARISON: Prior examination of 06/09/2023. CLINICAL HISTORY: Abdominal pain, acute, nonlocalized. Rectal cancer. Status post chemotherapy and radiation therapy following APR. *tracking code: Bo* FINDINGS: LOWER CHEST: No acute abnormality. LIVER: The liver is unremarkable. GALLBLADDER AND BILE DUCTS: Gallbladder is unremarkable. No biliary ductal dilatation. SPLEEN: No acute abnormality. PANCREAS: No acute abnormality. ADRENAL GLANDS: No acute abnormality. KIDNEYS, URETERS AND BLADDER: Surgical changes of cystectomy and ileal conduit formation are identified. There is developed moderate bilateral hydronephrosis and hydroureter to the level of the ureteroenteric anastomosis. The ileal conduit itself is decompressed. 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 There is normal cortical enhancement and preserved cortical thickness. No perinephric inflammatory stranding or fluid collections are identified. As noted above, the ileal conduit itself is decompressed with a urostomy identified within the right mid  abdomen. The urinary bladder is surgically absent. GI AND BOWEL: Stomach demonstrates no acute abnormality. A distal small bowel obstruction is present with an abrupt transition identified within the fecalization of intraluminal contents proximal to the point of obstruction secondary to stasis. Distally, the bowel appears decompressed. Surgical changes of distal proctocolectomy with descending colostomy within the left mid abdomen are identified. Other visualized portions of the small and large bowel are unremarkable. PERITONEUM AND RETROPERITONEUM: No ascites. No free air. VASCULATURE: Decompression of the iliocaval venous structures may reflect intravascular depletion. The abdominal vasculature is otherwise unremarkable. LYMPH NODES: No lymphadenopathy. REPRODUCTIVE ORGANS: The prostate gland is not clearly identified, and changes of prostatectomy are suspected. Infiltrative soft tissue within the operative bed may reflect postsurgical changes/scarring, appears asymmetrically thickened within the left perineal region, similar to prior examination and recurrent or residual disease is not excluded. This is better assessed on prior MRI examination of the pelvis of 06/24/2023. BONES AND SOFT TISSUES: Osseous structures are age-appropriate. No acute bone abnormality. No lytic or blastic bone lesion. Infiltrative soft tissue within the operative bed may reflect postsurgical changes/scarring, appears asymmetrically thickened within the left perineal region, similar to prior examination and recurrent  or residual disease is not excluded. This is better assessed on prior MRI examination of the pelvis of 06/24/2023. IMPRESSION: 1. Distal small bowel obstruction with single transition point within the deep pelvis. 2. Moderate bilateral hydronephrosis and hydroureter to the ureteroenteric anastomoses favoring anastomotic strictures , less likely developing chronic reflux, with a decompressed ileal conduit. 3. Infiltrative soft  tissue within the operative bed with asymmetric thickening in the left perineal region, similar to prior, with recurrent or residual disease not excluded. 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 However, if there is evidence of recurring disease (clinical symptoms, laboratory evaluation), PET/CT examination may be more helpful for further evaluation. Electronically signed by: Dorethia Molt MD 07/07/2024 08:27 PM EST RP Workstation: [id number]           LOS: 1 day   Time spent= 35 mins    [name], MD [facility]  If 7PM-7AM, please contact night-coverage  07/08/2024, 8:26 AM

## 2024-07-09 DIAGNOSIS — K56609 Unspecified intestinal obstruction, unspecified as to partial versus complete obstruction: Secondary | ICD-10-CM | POA: Diagnosis not present

## 2024-07-09 MED ORDER — HEPARIN SOD (PORK) LOCK FLUSH 100 UNIT/ML IV SOLN
500.0000 [IU] | Freq: Once | INTRAVENOUS | Status: AC
  Administered 2024-07-09: 500 [IU] via INTRAVENOUS
  Filled 2024-07-09: qty 5

## 2024-07-09 NOTE — Progress Notes (Signed)
 Subjective: Patient tolerating full liquid diet well.  Continues to have significant ostomy output.  Denies any significant abdominal pain.  He does have some cramping present.  Objective: Vital signs in last 24 hours: Temp:  [98.2 F (36.8 C)-98.6 F (37 C)] 98.3 F (36.8 C) (12/06 0330) Pulse Rate:  [69-86] 69 (12/06 0330) Resp:  [17] 17 (12/06 0330) BP: (123-151)/(91-99) 136/94 (12/06 0330) SpO2:  [92 %-93 %] 93 % (12/06 0330) Last BM Date : 07/06/24  Intake/Output from previous day: 12/05 0701 - 12/06 0700 In: 480 [P.O.:480] Out: -  Intake/Output this shift: No intake/output data recorded.  General appearance: alert, cooperative, and no distress GI: Soft with no point tenderness noted.  No distention noted.  Left-sided ostomy patent.  Lab Results:  Recent Labs    07/07/24 1725 07/08/24 0447  WBC 11.8* 9.3  HGB 17.3* 15.4  HCT 51.4 45.2  PLT 318 290   BMET Recent Labs    07/07/24 1725 07/08/24 0447  NA 139 140  K 4.5 4.3  CL 98 103  CO2 23 22  GLUCOSE 137* 122*  BUN 28* 28*  CREATININE 1.87* 1.85*  CALCIUM 11.0* 9.6   PT/INR No results for input(s): LABPROT, INR in the last 72 hours.  Studies/Results: DG Abd 1 View Result Date: 07/08/2024 EXAM: 1 VIEW XRAY OF THE ABDOMEN 07/08/2024 10:02:59 AM COMPARISON: Same day. CLINICAL HISTORY: [id number] Small bowel obstruction (HCC) 881155 Small bowel obstruction (HCC) FINDINGS: LINES, TUBES AND DEVICES: Nasogastric tube tip has been partially withdrawn. The distal tip is still in the proximal stomach. BOWEL: Dilated small bowel loop is noted in the central abdomen, which is unchanged. Contrast is noted in the nondilated colon. SOFT TISSUES: No abnormal calcifications. BONES: No acute fracture. IMPRESSION: 1. Dilated small bowel loop in the central abdomen, unchanged, consistent with small bowel obstruction. 2. Nondilated colon with contrast. Electronically signed by: Lynwood Seip MD 07/08/2024 10:35 AM EST RP  Workstation: [id number]   DG Abd Portable 1V Result Date: 07/08/2024 EXAM: 1 VIEW XRAY OF THE ABDOMEN 07/08/2024 02:15:00 AM COMPARISON: 07/07/2024 CLINICAL HISTORY: SBO (small bowel obstruction) (HCC) FINDINGS: LINES, TUBES AND DEVICES: Enteric tube coursing below the hemidiaphragm with tip and side port overlying the expected region of the gastric lumen. BOWEL: Contrast opacifies gastric lumen on the second image. Nonobstructive bowel gas pattern. SOFT TISSUES: Surgical clips overlying the pelvis. No abnormal calcifications. Contrast within the renal collecting systems and ureters. BONES: No acute fracture. IMPRESSION: 1. No acute findings. Electronically signed by: Franky Stanford MD 07/08/2024 02:53 AM EST RP Workstation: [id number]   DG Abd Portable 1V-Small Bowel Protocol-Position Verification Result Date: 07/07/2024 EXAM: 1 VIEW XRAY OF THE ABDOMEN 07/07/2024 11:40:00 PM COMPARISON: None available. CLINICAL HISTORY: Encounter for imaging study to confirm nasogastric (NG) tube placement. FINDINGS: LINES, TUBES AND DEVICES: Enteric tube in place with tip and side port projecting over the stomach. Left chest wall port in place with tip overlying the expected area of the superior cavoatrial junction. BOWEL: Nonobstructive bowel gas pattern. SOFT TISSUES: No abnormal calcifications. BONES: No acute fracture. LUNGS: Lower lung volumes with interstitial coarsening and patchy bilateral airspace opacities. IMPRESSION: 1. Enteric tube in appropriate position. 2. Low lung volumes with interstitial coarsening and patchy bilateral airspace opacities. Electronically signed by: Norman Gatlin MD 07/07/2024 11:49 PM EST RP Workstation: [id number]   CT ABDOMEN PELVIS W CONTRAST Result Date: 07/07/2024 EXAM: CT ABDOMEN AND PELVIS WITH CONTRAST 07/07/2024 08:11:24 PM TECHNIQUE: CT of the abdomen and pelvis was performed with  the administration of 80 mL of iohexol  (OMNIPAQUE ) 300 MG/ML solution. Multiplanar reformatted  images are provided for review. Automated exposure control, iterative reconstruction, and/or weight-based adjustment of the mA/kV was utilized to reduce the radiation dose to as low as reasonably achievable. COMPARISON: Prior examination of 06/09/2023. CLINICAL HISTORY: Abdominal pain, acute, nonlocalized. Rectal cancer. Status post chemotherapy and radiation therapy following APR. *tracking code: Bo* FINDINGS: LOWER CHEST: No acute abnormality. LIVER: The liver is unremarkable. GALLBLADDER AND BILE DUCTS: Gallbladder is unremarkable. No biliary ductal dilatation. SPLEEN: No acute abnormality. PANCREAS: No acute abnormality. ADRENAL GLANDS: No acute abnormality. KIDNEYS, URETERS AND BLADDER: Surgical changes of cystectomy and ileal conduit formation are identified. There is developed moderate bilateral hydronephrosis and hydroureter to the level of the ureteroenteric anastomosis. The ileal conduit itself is decompressed. 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 There is normal cortical enhancement and preserved cortical thickness. No perinephric inflammatory stranding or fluid collections are identified. As noted above, the ileal conduit itself is decompressed with a urostomy identified within the right mid abdomen. The urinary bladder is surgically absent. GI AND BOWEL: Stomach demonstrates no acute abnormality. A distal small bowel obstruction is present with an abrupt transition identified within the fecalization of intraluminal contents proximal to the point of obstruction secondary to stasis. Distally, the bowel appears decompressed. Surgical changes of distal proctocolectomy with descending colostomy within the left mid abdomen are identified. Other visualized portions of the small and large bowel are unremarkable. PERITONEUM AND RETROPERITONEUM: No ascites. No free air. VASCULATURE:  Decompression of the iliocaval venous structures may reflect intravascular depletion. The abdominal vasculature is otherwise unremarkable. LYMPH NODES: No lymphadenopathy. REPRODUCTIVE ORGANS: The prostate gland is not clearly identified, and changes of prostatectomy are suspected. Infiltrative soft tissue within the operative bed may reflect postsurgical changes/scarring, appears asymmetrically thickened within the left perineal region, similar to prior examination and recurrent or residual disease is not excluded. This is better assessed on prior MRI examination of the pelvis of 06/24/2023. BONES AND SOFT TISSUES: Osseous structures are age-appropriate. No acute bone abnormality. No lytic or blastic bone lesion. Infiltrative soft tissue within the operative bed may reflect postsurgical changes/scarring, appears asymmetrically thickened within the left perineal region, similar to prior examination and recurrent or residual disease is not excluded. This is better assessed on prior MRI examination of the pelvis of 06/24/2023. IMPRESSION: 1. Distal small bowel obstruction with single transition point within the deep pelvis. 2. Moderate bilateral hydronephrosis and hydroureter to the ureteroenteric anastomoses favoring anastomotic strictures , less likely developing chronic reflux, with a decompressed ileal conduit. 3. Infiltrative soft tissue within the operative bed with asymmetric thickening in the left perineal region, similar to prior, with recurrent or residual disease not excluded. 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 However, if there is evidence of recurring disease (clinical symptoms, laboratory evaluation), PET/CT examination may be more helpful for further evaluation. Electronically signed by: Dorethia Molt MD 07/07/2024 08:27 PM EST RP Workstation: [id number]    Anti-infectives: Anti-infectives (From admission, onward)    None        Assessment/Plan: Impression: Small bowel obstruction resolved.  Will advance to regular diet.  Okay for discharge from surgery standpoint.  Follow-up here as needed.  LOS: 2 days    Oneil Budge 07/09/2024

## 2024-07-09 NOTE — Progress Notes (Signed)
 Regular diet for breakfast, patient reports tolerating diet with no issues.

## 2024-07-09 NOTE — Discharge Summary (Addendum)
 Physician Discharge Summary   Patient: Tanner Dudley MRN: [id number] DOB: 12-29-1965  Admit date:     07/07/2024  Discharge date: 07/09/24  Discharge Physician: [name]   PCP: [name], FNP   Recommendations at discharge:    F/u with your PCP in one week. F/u with general surgery in two weeks F/u with urology in one to two weeks  Discharge Diagnoses: Principal Problem:   SBO (small bowel obstruction) [facility])   Hospital Course:  58 y.o. 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   CT abdomen pelvis with contrast revealed distal small bowel obstruction with single transition point within the deep pelvis. Moderate bilateral hydronephrosis and hydroureter to the ureteroenteric anastomoses favoring anastomosis strictures, less likely developing chronic reflux, with a decompressed ileal conduit.    General surgery consulted.  Initial plan was to transfer to [facility] but they did not have any available beds so patient was subsequently admitted to [facility]  SBO, resolved, good ostomy output History of prior SBO Extensive abdominal surgeries done at [facility] with prolonged postoperative course Received IVF NG tube discontinued Tolerated regular diet prior to discharge. Appreciate Dr Hugo help on the case.    AKI, suspect multifactorial Baseline creatinine 0.9 with GFR greater than 60 Presented with creatinine 1.87 with GFR 42, BUN 28. Received IVF Bilateral hydronephrosis seen on CT scan He also has Urologic findings that are concerning for anastomotic strictures.  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 Recommend outpatient follow-up with medical oncology   Obesity BMI 33 Recommend weight loss outpatient with regular physical activity and healthy diet.   Disposition: Patient lives with her sister at her trailer.      Consultants: Surgery Procedures performed: None  Disposition: Home Diet recommendation:  Regular diet DISCHARGE MEDICATION: Allergies as of 07/09/2024   Not on File      Medication List     TAKE these medications    amLODipine  5 MG tablet Commonly known as: NORVASC  Take 1 tablet (5 mg total) by mouth daily.   escitalopram  20 MG tablet Commonly known as: LEXAPRO  Take 1 tablet (20 mg total) by mouth daily.   hydrochlorothiazide  12.5 MG tablet Commonly known as: HYDRODIURIL  Take 1 tablet (12.5 mg total) by mouth daily.        Follow-up Information     [name], FNP. Schedule an appointment as soon as possible for a visit in 1 week(s).   Specialty: Family Medicine Contact information: [address] [place] [address] [phone]         [name], MD. Schedule an appointment as soon as possible for a visit in 2 week(s).   Specialty: General Surgery Contact information: [address] [place] [address] [phone]         [facility]. Schedule an appointment as soon as possible for a visit in 1 week(s).   Contact information: [address] [place]   [address] [phone]               Discharge Exam: Tanner Dudley  07/07/24 1639 07/08/24 0016  Weight: 96.5 kg 93.4 kg   General exam: Appears calm and comfortable  Respiratory system: Clear to auscultation. Respiratory effort normal. Cardiovascular system: S1 & S2 heard, RRR. No JVD, murmurs, rubs, gallops or clicks. No pedal  edema. Gastrointestinal system: Abdomen is nondistended, soft and nontender. No organomegaly or masses felt.  Colostomy in place containing brownish liquid Central nervous system: Alert and oriented. No focal neurological deficits. Extremities: Symmetric 5 x 5 power. Skin: No rashes, lesions or ulcers Psychiatry: Judgement and insight appear normal. Mood & affect appropriate.  Condition at discharge: good  The results of significant diagnostics from this hospitalization (including imaging, microbiology, ancillary and laboratory) are listed below for reference.   Imaging Studies: DG Abd 1 View Result Date: 07/08/2024 EXAM: 1 VIEW XRAY OF THE ABDOMEN 07/08/2024 10:02:59 AM COMPARISON: Same day. CLINICAL HISTORY: 881155 Small bowel obstruction (HCC) 881155 Small bowel obstruction (HCC) FINDINGS: LINES, TUBES AND DEVICES: Nasogastric tube tip has been partially withdrawn. The distal tip is still in the proximal stomach. BOWEL: Dilated small bowel loop is noted in the central abdomen, which is unchanged. Contrast is noted in the nondilated colon. SOFT TISSUES: No abnormal calcifications. BONES: No acute fracture. IMPRESSION: 1. Dilated small bowel loop in the central abdomen, unchanged, consistent with small bowel obstruction. 2. Nondilated colon with contrast. Electronically signed by: Lynwood Seip MD 07/08/2024 10:35 AM EST RP Workstation: [id number]   DG Abd Portable 1V Result Date: 07/08/2024 EXAM: 1 VIEW XRAY OF THE ABDOMEN 07/08/2024 02:15:00 AM COMPARISON: 07/07/2024 CLINICAL HISTORY: SBO (small bowel obstruction) (HCC) FINDINGS: LINES, TUBES AND DEVICES: Enteric tube coursing below the hemidiaphragm with tip and side port overlying the expected region of the gastric lumen. BOWEL: Contrast opacifies gastric lumen on the second image. Nonobstructive bowel gas pattern. SOFT TISSUES: Surgical clips overlying the pelvis. No abnormal calcifications. Contrast within the renal collecting systems and  ureters. BONES: No acute fracture. IMPRESSION: 1. No acute findings. Electronically signed by: Franky Stanford MD 07/08/2024 02:53 AM EST RP Workstation: [id number]   DG Abd Portable 1V-Small Bowel Protocol-Position Verification Result Date: 07/07/2024 EXAM: 1 VIEW XRAY OF THE ABDOMEN 07/07/2024 11:40:00 PM COMPARISON: None available. CLINICAL HISTORY: Encounter for imaging study to confirm nasogastric (NG) tube placement. FINDINGS: LINES, TUBES AND DEVICES: Enteric tube in place with tip and side port projecting over the stomach. Left chest wall port in place with tip overlying the expected area of the superior cavoatrial junction. BOWEL: Nonobstructive bowel gas pattern. SOFT TISSUES: No abnormal calcifications. BONES: No acute fracture. LUNGS: Lower lung volumes with interstitial coarsening and patchy bilateral airspace opacities. IMPRESSION: 1. Enteric tube in appropriate position. 2. Low lung volumes with interstitial coarsening and patchy bilateral airspace opacities. Electronically signed by: Norman Gatlin MD 07/07/2024 11:49 PM EST RP Workstation: [id number]   CT ABDOMEN PELVIS W CONTRAST Result Date: 07/07/2024 EXAM: CT ABDOMEN AND PELVIS WITH CONTRAST 07/07/2024 08:11:24 PM TECHNIQUE: CT of the abdomen and pelvis was performed with the administration of 80 mL of iohexol  (OMNIPAQUE ) 300 MG/ML solution. Multiplanar reformatted images are provided for review. Automated exposure control, iterative reconstruction, and/or weight-based adjustment of the mA/kV was utilized to reduce the radiation dose to as low as reasonably achievable. COMPARISON: Prior examination of 06/09/2023. CLINICAL HISTORY: Abdominal pain, acute, nonlocalized. Rectal cancer. Status post chemotherapy and radiation therapy following APR. *tracking code: Bo* FINDINGS: LOWER CHEST: No acute abnormality. LIVER: The liver is unremarkable. GALLBLADDER AND BILE DUCTS: Gallbladder is unremarkable. No biliary ductal dilatation. SPLEEN: No acute  abnormality. PANCREAS: No acute abnormality. ADRENAL GLANDS: No acute abnormality. KIDNEYS, URETERS AND BLADDER: Surgical changes of cystectomy and ileal conduit formation are identified. There is developed moderate bilateral hydronephrosis and hydroureter to the level of the ureteroenteric anastomosis. The ileal conduit itself is decompressed. 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 There is normal cortical enhancement and preserved cortical thickness. No perinephric inflammatory stranding or fluid collections are identified. As noted above, the ileal conduit itself is decompressed with a urostomy identified within the right mid abdomen. The urinary bladder is surgically absent. GI AND BOWEL: Stomach demonstrates no acute abnormality. A distal small bowel obstruction is present with an abrupt transition identified within the fecalization of intraluminal contents proximal to the point of obstruction secondary to stasis. Distally, the bowel appears decompressed. Surgical changes of distal proctocolectomy with descending colostomy within the left mid abdomen are identified. Other visualized portions of the small and large bowel are unremarkable. PERITONEUM AND RETROPERITONEUM: No ascites. No free air. VASCULATURE: Decompression of the iliocaval venous structures may reflect intravascular depletion. The abdominal vasculature is otherwise unremarkable. LYMPH NODES: No lymphadenopathy. REPRODUCTIVE ORGANS: The prostate gland is not clearly identified, and changes of prostatectomy are suspected. Infiltrative soft tissue within the operative bed may reflect postsurgical changes/scarring, appears asymmetrically thickened within the left perineal region, similar to prior examination and recurrent or residual disease is not excluded. This is better assessed on prior MRI examination of the pelvis of  06/24/2023. BONES AND SOFT TISSUES: Osseous structures are age-appropriate. No acute bone abnormality. No lytic or blastic bone lesion. Infiltrative soft tissue within the operative bed may reflect postsurgical changes/scarring, appears asymmetrically thickened within the left perineal region, similar to prior examination and recurrent or residual disease is not excluded. This is better assessed on prior MRI examination of the pelvis of 06/24/2023. IMPRESSION: 1. Distal small bowel obstruction with single transition point within the deep pelvis. 2. Moderate bilateral hydronephrosis and hydroureter to the ureteroenteric anastomoses favoring anastomotic strictures , less likely developing chronic reflux, with a decompressed ileal conduit. 3. Infiltrative soft tissue within the operative bed with asymmetric thickening in the left perineal region, similar to prior, with recurrent or residual disease not excluded. 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 However, if there is evidence of recurring disease (clinical symptoms, laboratory evaluation), PET/CT examination may be more helpful for further evaluation. Electronically signed by: Dorethia Molt MD 07/07/2024 08:27 PM EST RP Workstation: [id number]    Microbiology: Results for orders placed or performed during the hospital encounter of 07/23/20  SARS CORONAVIRUS 2 (TAT 6-24 HRS) Nasopharyngeal Nasopharyngeal Swab     Status: None   Collection Time: 07/23/20  2:27 PM   Specimen: Nasopharyngeal Swab  Result Value Ref Range Status   SARS Coronavirus 2 NEGATIVE NEGATIVE Final    Comment: (NOTE) SARS-CoV-2 target nucleic acids are NOT DETECTED.  The SARS-CoV-2 RNA is generally detectable in upper and lower respiratory specimens during the acute phase of infection. Negative results do not preclude SARS-CoV-2 infection, do not rule out co-infections with other pathogens, and should not be used as  the sole basis for treatment or other patient management decisions. Negative results must be combined with clinical observations, patient history, and epidemiological information. The expected result is Negative.  Fact Sheet for Patients: hairslick.no  Fact Sheet for Healthcare Providers: quierodirigir.com  This test is not yet approved  or cleared by the [place]  FDA and  has been authorized for detection and/or diagnosis of SARS-CoV-2 by FDA under an Emergency Use Authorization (EUA). This EUA will remain  in effect (meaning this test can be used) for the duration of the COVID-19 declaration under Se ction 564(b)(1) of the Act, 21 U.S.C. section 360bbb-3(b)(1), unless the authorization is terminated or revoked sooner.  Performed at [facility], [address], [place], [place] [address]     Labs: CBC: Recent Labs  Lab 07/07/24 1725 07/08/24 0447  WBC 11.8* 9.3  NEUTROABS  --  7.6  HGB 17.3* 15.4  HCT 51.4 45.2  MCV 90.5 90.0  PLT 318 290   Basic Metabolic Panel: Recent Labs  Lab 07/07/24 1725 07/08/24 0447  NA 139 140  K 4.5 4.3  CL 98 103  CO2 23 22  GLUCOSE 137* 122*  BUN 28* 28*  CREATININE 1.87* 1.85*  CALCIUM 11.0* 9.6  MG  --  2.6*  PHOS  --  4.3   Liver Function Tests: Recent Labs  Lab 07/07/24 1725  AST 23  ALT 15  ALKPHOS 70  BILITOT 0.7  PROT 9.0*  ALBUMIN 5.0   CBG: No results for input(s): GLUCAP in the last 168 hours.  Discharge time spent: 40 minutes.  Signed: [name], MD [facility] 07/09/2024

## 2024-07-11 ENCOUNTER — Telehealth: Payer: Self-pay

## 2024-07-11 NOTE — Transitions of Care (Post Inpatient/ED Visit) (Signed)
   07/11/2024  Name: Tanner Dudley MRN: [id number] DOB: [birth year]  Today's TOC FU Call Status: Today's TOC FU Call Status:: Successful TOC FU Call Completed TOC FU Call Complete Date: 07/11/24  Patient's Name and Date of Birth confirmed. DOB, Name  Transition Care Management Follow-up Telephone Call Date of Discharge: 07/09/24 [facility] ([facility] Type of Discharge: Inpatient Admission Primary Inpatient Discharge Diagnosis:: Smal Bowel Obstruction How have you been since you were released from the hospital?: Better Any questions or concerns?: Yes Patient Questions/Concerns:: (S) patient called to schedule appointment with urology but was told that no referral was placed Patient Questions/Concerns Addressed: Notified Provider of Patient Questions/Concerns  Items Reviewed: Did you receive and understand the discharge instructions provided?: Yes Medications obtained,verified, and reconciled?: Yes (Medications Reviewed) Any new allergies since your discharge?: No Dietary orders reviewed?: Yes Do you have support at home?: Yes People in Home [RPT]: sibling(s)  Medications Reviewed Today: Medications Reviewed Today     Reviewed by Lavelle Charmaine NOVAK, LPN (Licensed Practical Nurse) on 07/11/24 at 1055  Med List Status: <None>   Medication Order Taking? Sig Documenting Provider Last Dose Status Informant  amLODipine  (NORVASC ) 5 MG tablet [id number] No Take 1 tablet (5 mg total) by mouth daily. [name], FNP 07/07/2024 Morning Active Self, Pharmacy Records  escitalopram  (LEXAPRO ) 20 MG tablet [id number] No Take 1 tablet (20 mg total) by mouth daily. [name], FNP 07/07/2024 Morning Active Self, Pharmacy Records  hydrochlorothiazide  (HYDRODIURIL ) 12.5 MG tablet [id number] No Take 1 tablet (12.5 mg total) by mouth daily. [name], FNP Past Week Active Self, Pharmacy Records            Home Care and Equipment/Supplies: Were Home Health Services Ordered?:  No Any new equipment or medical supplies ordered?: No  Functional Questionnaire: Do you need assistance with bathing/showering or dressing?: No Do you need assistance with meal preparation?: No Do you need assistance with eating?: No Do you have difficulty maintaining continence: No Do you need assistance with getting out of bed/getting out of a chair/moving?: No Do you have difficulty managing or taking your medications?: No  Follow up appointments reviewed: PCP Follow-up appointment confirmed?: Yes Date of PCP follow-up appointment?: 07/15/24 Follow-up Provider: Doffing [facility] Follow-up appointment confirmed?: No Reason Specialist Follow-Up Not Confirmed: [facility] Calling Clinician Notified Provider Practice of Needed Appointment Do you need transportation to your follow-up appointment?: No Do you understand care options if your condition(s) worsen?: Yes-patient verbalized understanding    SIGNATURE Charmaine Lavelle, LPN [facility] Advisor [place] [facility] You Are. We Are. [facility] Direct Dial [phone]

## 2024-07-11 NOTE — Telephone Encounter (Signed)
 Patient called to schedule but advised he wasn't sure why the hospital advised him he needed to see urology. He is not having any urological issues he advised. I ade him aware if he starts to have any to call the office to schedule.

## 2024-07-11 NOTE — Telephone Encounter (Signed)
 Copied from CRM [id number]. Topic: Appointments - Scheduling Inquiry for Clinic >> Jul 11, 2024  9:29 AM Carlyon D wrote: Reason for CRM: Pt is calling for hospital follow up appt, pt was discharged 12/6 and told to see pcp within a week. Soonest appt for Hospital f/u was 03/17 pt scheduled but would like something sooner and would also like a call back in regards to the appt.

## 2024-07-11 NOTE — Telephone Encounter (Signed)
 Appt scheduled

## 2024-07-12 ENCOUNTER — Other Ambulatory Visit: Payer: Self-pay

## 2024-07-12 DIAGNOSIS — N1330 Unspecified hydronephrosis: Secondary | ICD-10-CM

## 2024-07-15 ENCOUNTER — Ambulatory Visit (INDEPENDENT_AMBULATORY_CARE_PROVIDER_SITE_OTHER)

## 2024-07-15 VITALS — BP 145/94 | HR 98 | Ht 67.0 in | Wt 214.0 lb

## 2024-07-15 DIAGNOSIS — K56609 Unspecified intestinal obstruction, unspecified as to partial versus complete obstruction: Secondary | ICD-10-CM | POA: Diagnosis not present

## 2024-07-15 DIAGNOSIS — I10 Essential (primary) hypertension: Secondary | ICD-10-CM

## 2024-07-15 DIAGNOSIS — N179 Acute kidney failure, unspecified: Secondary | ICD-10-CM | POA: Diagnosis not present

## 2024-07-15 DIAGNOSIS — Z933 Colostomy status: Secondary | ICD-10-CM

## 2024-07-15 NOTE — Progress Notes (Signed)
 Established Patient Office Visit  Subjective   Patient ID: Tanner Dudley, male    DOB: 1966-06-21  Age: 58 y.o. MRN: [id number]  Chief Complaint  Patient presents with   Hospitalization Follow-up    HPI Patient admitted to [facility] on 12/4-12/6/25 for the following:  Discharge Diagnoses: Principal Problem:   SBO (small bowel obstruction) [facility])    Hospital Course:   58 y.o. 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   CT abdomen pelvis with contrast revealed distal small bowel obstruction with single transition point within the deep pelvis. Moderate bilateral hydronephrosis and hydroureter to the ureteroenteric anastomoses favoring anastomosis strictures, less likely developing chronic reflux, with a decompressed ileal conduit.    General surgery consulted.  Initial plan was to transfer to [facility] but they did not have any available beds so patient was subsequently admitted to [facility]   SBO, resolved, good ostomy output History of prior SBO Extensive abdominal surgeries done at [facility] with prolonged postoperative course Received IVF NG tube discontinued Tolerated regular diet prior to discharge. Appreciate Dr Hugo help on the case.     AKI, suspect multifactorial Baseline creatinine 0.9 with GFR greater than 60 Presented with creatinine 1.87 with GFR 42, BUN 28. Received IVF Bilateral hydronephrosis seen on CT scan He also has Urologic findings that are concerning for anastomotic strictures.  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 Recommend outpatient follow-up with medical oncology   Obesity BMI 33 Recommend weight loss outpatient with regular physical activity and healthy diet  Patient Active Problem List   Diagnosis Date Noted   AKI (acute kidney injury) 07/17/2024   SBO (small bowel obstruction) (HCC) 07/07/2024   Acute non-recurrent maxillary sinusitis 05/22/2024   Bilateral impacted cerumen 05/22/2024   Primary hypertension 02/16/2024   Erythrocytosis 06/16/2023   Snoring 07/30/2020   Witnessed episode of apnea 07/30/2020   Excessive sleepiness 07/30/2020   Tobacco use disorder 03/14/2019   S/P colostomy (HCC) 10/09/2017   Rectal cancer (HCC)    Rectal mass    Proctocolitis with rectal bleeding    Rectal bleeding 09/14/2017   Diarrhea 09/14/2017   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  Constitutional:      Appearance: Normal appearance.  HENT:     Head: Normocephalic.  Eyes:     Extraocular Movements: Extraocular movements intact.     Pupils: Pupils are equal, round, and reactive to light.  Cardiovascular:  Rate and Rhythm: Normal rate and regular rhythm.  Pulmonary:     Effort: Pulmonary effort is normal.     Breath sounds: Normal breath sounds.  Musculoskeletal:     Cervical back: Normal range of motion and neck supple.  Neurological:     Mental Status: He is alert and oriented to person, place, and  time.  Psychiatric:        Mood and Affect: Mood normal.        Thought Content: Thought content normal.      Results for orders placed or performed in visit on 07/15/24  Basic Metabolic Panel (BMET)  Result Value Ref Range   Glucose 83 70 - 99 mg/dL   BUN 21 6 - 24 mg/dL   Creatinine, Ser 8.09 (H) 0.76 - 1.27 mg/dL   eGFR 41 (L) >40 fO/fpw/8.26   BUN/Creatinine Ratio 11 9 - 20   Sodium 139 134 - 144 mmol/L   Potassium 5.0 3.5 - 5.2 mmol/L   Chloride 101 96 - 106 mmol/L   CO2 23 20 - 29 mmol/L   Calcium 9.4 8.7 - 10.2 mg/dL    Last CBC Lab Results  Component Value Date   WBC 9.3 07/08/2024   HGB 15.4 07/08/2024   HCT 45.2 07/08/2024   MCV 90.0 07/08/2024   MCH 30.7 07/08/2024   RDW 13.7 07/08/2024   PLT 290 07/08/2024   Last metabolic panel Lab Results  Component Value Date   GLUCOSE 83 07/15/2024   NA 139 07/15/2024   K 5.0 07/15/2024   CL 101 07/15/2024   CO2 23 07/15/2024   BUN 21 07/15/2024   CREATININE 1.90 (H) 07/15/2024   GFRNONAA 42 (L) 07/08/2024   CALCIUM 9.4 07/15/2024   PHOS 4.3 07/08/2024   PROT 9.0 (H) 07/07/2024   ALBUMIN 5.0 07/07/2024   BILITOT 0.7 07/07/2024   ALKPHOS 70 07/07/2024   AST 23 07/07/2024   ALT 15 07/07/2024   ANIONGAP 16 (H) 07/08/2024   Last thyroid functions Lab Results  Component Value Date   TSH 1.234 09/15/2017   The 10-year ASCVD risk score (Arnett DK, et al., 2019) is: 23%    Assessment & Plan:   Problem List Items Addressed This Visit       Cardiovascular and Mediastinum   Primary hypertension   Recent episode likely due to missed medication dose. Blood pressure improved to 130/88 after recheck. No medication changes made today.         Digestive   SBO (small bowel obstruction) (HCC)   Patient reports improvement of initial symptoms.  Advised to schedule f/u with general surgeon, Dr. Mavis.        Relevant Orders   Ambulatory referral to General Surgery     Genitourinary   AKI (acute kidney  injury) - Primary   Likely secondary to dehydration during recent hospital stay. Creatinine was elevated, but IV fluids were administered. - Rechecked kidney function with lab test today.       Relevant Orders   Basic Metabolic Panel (BMET) (Completed)    No follow-ups on file.    Leita Longs, FNP

## 2024-07-16 LAB — BASIC METABOLIC PANEL WITH GFR
BUN/Creatinine Ratio: 11 (ref 9–20)
BUN: 21 mg/dL (ref 6–24)
CO2: 23 mmol/L (ref 20–29)
Calcium: 9.4 mg/dL (ref 8.7–10.2)
Chloride: 101 mmol/L (ref 96–106)
Creatinine, Ser: 1.9 mg/dL — ABNORMAL HIGH (ref 0.76–1.27)
Glucose: 83 mg/dL (ref 70–99)
Potassium: 5 mmol/L (ref 3.5–5.2)
Sodium: 139 mmol/L (ref 134–144)
eGFR: 41 mL/min/1.73 — ABNORMAL LOW (ref >59)

## 2024-07-17 ENCOUNTER — Other Ambulatory Visit: Payer: Self-pay

## 2024-07-17 ENCOUNTER — Ambulatory Visit: Payer: Self-pay

## 2024-07-17 DIAGNOSIS — N179 Acute kidney failure, unspecified: Secondary | ICD-10-CM

## 2024-07-17 NOTE — Assessment & Plan Note (Signed)
 Recent episode likely due to missed medication dose. Blood pressure improved to 130/88 after recheck. No medication changes made today.

## 2024-07-17 NOTE — Assessment & Plan Note (Signed)
 Likely secondary to dehydration during recent hospital stay. Creatinine was elevated, but IV fluids were administered. - Rechecked kidney function with lab test today.

## 2024-07-17 NOTE — Assessment & Plan Note (Signed)
 Patient reports improvement of initial symptoms.  Advised to schedule f/u with general surgeon, Dr. Mavis.

## 2024-08-23 ENCOUNTER — Ambulatory Visit: Admitting: General Surgery

## 2024-09-28 ENCOUNTER — Ambulatory Visit: Admitting: Urology

## 2024-10-18 ENCOUNTER — Inpatient Hospital Stay: Payer: Self-pay

## 2024-11-16 ENCOUNTER — Ambulatory Visit

## 2024-12-16 ENCOUNTER — Inpatient Hospital Stay

## 2024-12-23 ENCOUNTER — Inpatient Hospital Stay: Admitting: Oncology
